# Patient Record
Sex: Male | Born: 1990 | Race: White | Hispanic: No | Marital: Married | Smoking: Never smoker
Health system: Southern US, Community
[De-identification: ages and names within clinical notes are randomized; demographics above are authoritative.]

## PROBLEM LIST (undated history)

## (undated) DIAGNOSIS — H5350 Unspecified color vision deficiencies: Secondary | ICD-10-CM

## (undated) DIAGNOSIS — K602 Anal fissure, unspecified: Secondary | ICD-10-CM

## (undated) DIAGNOSIS — K56609 Unspecified intestinal obstruction, unspecified as to partial versus complete obstruction: Secondary | ICD-10-CM

## (undated) DIAGNOSIS — F4310 Post-traumatic stress disorder, unspecified: Secondary | ICD-10-CM

## (undated) DIAGNOSIS — F419 Anxiety disorder, unspecified: Secondary | ICD-10-CM

## (undated) DIAGNOSIS — F329 Major depressive disorder, single episode, unspecified: Secondary | ICD-10-CM

## (undated) DIAGNOSIS — F32A Depression, unspecified: Secondary | ICD-10-CM

## (undated) HISTORY — DX: Anxiety disorder, unspecified: F41.9

## (undated) HISTORY — PX: SHOULDER ARTHROSCOPY: SHX128

## (undated) HISTORY — PX: DG HAND RIGHT COMPLETE (ARMC HX): HXRAD1530

## (undated) HISTORY — DX: Anal fissure, unspecified: K60.2

## (undated) HISTORY — DX: Post-traumatic stress disorder, unspecified: F43.10

## (undated) HISTORY — DX: Unspecified intestinal obstruction, unspecified as to partial versus complete obstruction: K56.609

## (undated) HISTORY — DX: Depression, unspecified: F32.A

## (undated) HISTORY — DX: Major depressive disorder, single episode, unspecified: F32.9

## (undated) HISTORY — PX: COLONOSCOPY: SHX174

---
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
## 2012-05-03 ENCOUNTER — Ambulatory Visit (HOSPITAL_COMMUNITY)
Admission: RE | Admit: 2012-05-03 | Discharge: 2012-05-03 | Disposition: A | Discharge status: Home / Self Care | Payer: BC Managed Care – PPO | Attending: Psychiatry | Admitting: Psychiatry

## 2012-05-03 DIAGNOSIS — F3289 Other specified depressive episodes: Secondary | ICD-10-CM | POA: Insufficient documentation

## 2012-05-03 DIAGNOSIS — F329 Major depressive disorder, single episode, unspecified: Secondary | ICD-10-CM | POA: Insufficient documentation

## 2012-05-03 NOTE — BH Assessment (Signed)
Assessment Note   Todd Drake is an 22 y.o. male that presents with his mother at the request of Dr. Donell Beers for worsening depression and r/o PTSD.  Pt reports that seven months ago, his roommate (in [place]) killed himself while he was there.  Pt then lost his dog soon thereafter.  Since then, pt admits worsening depressive symptoms, that have increased as of late to decreased level of functioning and inability to handle daily living.  Pt reports difficulty falling or staying asleep, poor appetite, apathy, hopelessness, nightmares and flashbacks, and recently, auditory and visual hallucinations (inaudible sounds and occasionally sees reptiles) without command.  Pt voices occasional SI without plan or intent, last time was several weeks ago.  Pt has come from [place], where he currently resides, to stay with his mother, until he can stabilize.  Pt does admit a lengthy history of SA, starting at age 44 when he began smoking Cannibus; admits use of Alcohol, Cocaine, Heroine, and Pharmaceuticals.  In 2009, pt was hospitalized in [place] for substance abuse treatment.  Pt has not abused any substances in three years +, per pt report.  Pt started seeing Dr. Donell Beers, but is refusing medications to treat symptoms "It is just my preference," so he was referred to the [facility] psych outpatient program.  Pt is to begin program on Monday, May 5th. Emergency resources and recommendations provided and pt is able to contract for safety.    Axis I: r/o PTSD Axis II: Deferred Axis III: No past medical history on file. Axis IV: other psychosocial or environmental problems and problems related to social environment Axis V: 41-50 serious symptoms  Past Medical History: No past medical history on file.  No past surgical history on file.  Family History: No family history on file.  Social History:  has no tobacco, alcohol, and drug history on file.  Additional Social History:  Alcohol / Drug Use Pain Medications:  No Prescriptions: No; refuses Over the Counter: No History of alcohol / drug use?: Yes (Pt reports "You name it, I've done it. Clean 3 years.") Longest period of sobriety (when/how long): 3 years Negative Consequences of Use: Financial;Legal;Personal relationships;Work / School  CIWA:   COWS:    Allergies: Allergies not on file  Home Medications:  (Not in a hospital admission)  OB/GYN Status:  No LMP for male patient.  General Assessment Data Location of Assessment: [facility] Assessment Services ACT Assessment: Yes Living Arrangements: Non-relatives/Friends Can pt return to current living arrangement?: Yes Admission Status: Voluntary Is patient capable of signing voluntary admission?: Yes Transfer from:  (Dr. Donell Beers) Referral Source: Psychiatrist  Education Status Is patient currently in school?: No Highest grade of school patient has completed: college  Risk to self Suicidal Ideation: Yes-Currently Present Suicidal Intent: No Is patient at risk for suicide?: Yes Suicidal Plan?: No Access to Means: Yes Specify Access to Suicidal Means: sharps and meds available What has been your use of drugs/alcohol within the last 12 months?: none in 3 years per report Previous Attempts/Gestures: No How many times?: 0 Other Self Harm Risks: neglecting Triggers for Past Attempts: Unpredictable Intentional Self Injurious Behavior: None Family Suicide History: No Recent stressful life event(s): Conflict (Comment);Trauma (Comment);Turmoil (Comment) (two friends committed suicide within past year; dog died) Persecutory voices/beliefs?: No Depression: Yes Depression Symptoms: Insomnia;Isolating;Fatigue;Loss of interest in usual pleasures;Feeling worthless/self pity Substance abuse history and/or treatment for substance abuse?: Yes Suicide prevention information given to non-admitted patients: Yes  Risk to Others Homicidal Ideation: No Thoughts of  Harm to Others: No Current Homicidal  Intent: No Current Homicidal Plan: No Access to Homicidal Means: No Identified Victim: none per pt History of harm to others?: No Assessment of Violence: None Noted Violent Behavior Description: pt is apathetic and somnolent Does patient have access to weapons?: No Criminal Charges Pending?: No Does patient have a court date: No  Psychosis Hallucinations: Auditory;Visual (inaudible voices and sees reptiles) Delusions: None noted  Mental Status Report Appear/Hygiene: Other (Comment) (casual in street clothes) Eye Contact: Good Motor Activity: Unremarkable Speech: Soft Level of Consciousness: Alert Mood: Depressed;Sad;Empty;Ambivalent Affect: Apathetic;Appropriate to circumstance;Depressed;Sad Anxiety Level: Minimal Thought Processes: Relevant Judgement: Unimpaired Orientation: Person;Place;Time;Situation;Appropriate for developmental age Obsessive Compulsive Thoughts/Behaviors: Moderate  Cognitive Functioning Concentration: Decreased Memory: Recent Intact;Remote Intact IQ: Average Insight: Fair Impulse Control: Fair Appetite: Poor Weight Loss: 0 Weight Gain: 0 Sleep: Decreased Total Hours of Sleep:  (2-4 hours) Vegetative Symptoms: Staying in bed  ADLScreening [facility] Assessment Services) Patient's cognitive ability adequate to safely complete daily activities?: Yes Patient able to express need for assistance with ADLs?: Yes Independently performs ADLs?: Yes (appropriate for developmental age)  Abuse/Neglect [facility]) Physical Abuse: Denies Verbal Abuse: Denies Sexual Abuse: Denies  Prior Inpatient Therapy Prior Inpatient Therapy: Yes Prior Therapy Dates: 2009 Prior Therapy Facilty/Provider(s): In [place] for SA Reason for Treatment: multiple substance use  Prior Outpatient Therapy Prior Outpatient Therapy: Yes Prior Therapy Dates: currently Prior Therapy Facilty/Provider(s): Dr. Donell Beers Reason for Treatment: depression and r/o PTSD  ADL Screening (condition at  time of admission) Patient's cognitive ability adequate to safely complete daily activities?: Yes Patient able to express need for assistance with ADLs?: Yes Independently performs ADLs?: Yes (appropriate for developmental age) Weakness of Legs: None Weakness of Arms/Hands: None  Home Assistive Devices/Equipment Home Assistive Devices/Equipment: None    Abuse/Neglect Assessment (Assessment to be complete while patient is alone) Physical Abuse: Denies Verbal Abuse: Denies Sexual Abuse: Denies Exploitation of patient/patient's resources: Denies Self-Neglect: Denies Values / Beliefs Cultural Requests During Hospitalization: None Spiritual Requests During Hospitalization: None Consults Spiritual Care Consult Needed: No Social Work Consult Needed: No Merchant navy officer (For Healthcare) Advance Directive: Patient does not have advance directive    Additional Information 1:1 In Past 12 Months?: No CIRT Risk: No Elopement Risk: No Does patient have medical clearance?: No     Disposition: Begins intensive outpatient treatment on Monday, May 5th.   Disposition Initial Assessment Completed for this Encounter: Yes Disposition of Patient: Outpatient treatment Type of outpatient treatment: Psych Intensive Outpatient (scheduled to begin Monday, May 5th)  On Site Evaluation by:   Reviewed with Physician:     Angelica Ran 05/03/2012 2:20 PM

## 2012-05-04 ENCOUNTER — Telehealth: Payer: Self-pay | Admitting: Hematology & Oncology

## 2012-05-04 NOTE — Telephone Encounter (Signed)
Left pt message to call and schedule appointment °

## 2012-05-05 ENCOUNTER — Telehealth: Payer: Self-pay | Admitting: Hematology & Oncology

## 2012-05-05 NOTE — Telephone Encounter (Signed)
Left message for pt to call and schedule appointment °

## 2012-05-06 ENCOUNTER — Telehealth: Payer: Self-pay | Admitting: Hematology & Oncology

## 2012-05-06 NOTE — Telephone Encounter (Signed)
Left pt message to call for appointment. Todd Drake at referring aware Pt will not return my calls.

## 2012-05-16 ENCOUNTER — Encounter (HOSPITAL_COMMUNITY): Payer: Self-pay

## 2012-05-16 ENCOUNTER — Other Ambulatory Visit (HOSPITAL_COMMUNITY): Payer: BC Managed Care – PPO | Attending: Psychiatry | Admitting: Psychiatry

## 2012-05-16 DIAGNOSIS — F329 Major depressive disorder, single episode, unspecified: Secondary | ICD-10-CM | POA: Insufficient documentation

## 2012-05-16 DIAGNOSIS — F3289 Other specified depressive episodes: Secondary | ICD-10-CM | POA: Insufficient documentation

## 2012-05-16 DIAGNOSIS — F331 Major depressive disorder, recurrent, moderate: Secondary | ICD-10-CM

## 2012-05-16 DIAGNOSIS — F4310 Post-traumatic stress disorder, unspecified: Secondary | ICD-10-CM
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
## 2012-05-16 NOTE — Progress Notes (Signed)
    Daily Group Progress Note  Program: IOP  Group Time: 9:00-10:30 am   Participation Level: Active  Behavioral Response: Appropriate  Type of Therapy:  Process Group  Summary of Progress: Today was patients first day in the group. He was introduced, attentive and observed the group process.      Group Time: 10:30 am - 12:00 pm   Participation Level:  Active  Behavioral Response: Appropriate  Type of Therapy: Psycho-education Group  Summary of Progress:  Patient participated in a grief and loss group, identified current losses and effective grieving strategies.   Carman Ching, LCSW

## 2012-05-17 ENCOUNTER — Other Ambulatory Visit (HOSPITAL_COMMUNITY): Payer: Self-pay

## 2012-05-17 ENCOUNTER — Telehealth (HOSPITAL_COMMUNITY): Payer: Self-pay | Admitting: Psychiatry

## 2012-05-18 ENCOUNTER — Other Ambulatory Visit (HOSPITAL_COMMUNITY): Payer: BC Managed Care – PPO | Attending: Psychiatry | Admitting: Psychiatry

## 2012-05-18 DIAGNOSIS — F4310 Post-traumatic stress disorder, unspecified: Secondary | ICD-10-CM | POA: Insufficient documentation

## 2012-05-18 DIAGNOSIS — F329 Major depressive disorder, single episode, unspecified: Secondary | ICD-10-CM

## 2012-05-18 DIAGNOSIS — F332 Major depressive disorder, recurrent severe without psychotic features: Secondary | ICD-10-CM | POA: Insufficient documentation

## 2012-05-18 MED ORDER — MIRTAZAPINE 15 MG PO TABS: 15.0000 mg | ORAL_TABLET | Freq: Every day | ORAL | Status: DC

## 2012-05-18 NOTE — Progress Notes (Signed)
    Daily Group Progress Note  Program: IOP  Group Time: 9:00-10:30 am   Participation Level: None  Behavioral Response: Rigid  Type of Therapy:  Process Group  Summary of Progress: Patient returned after missing group yesterday. He was told he was missed. He sat quietly and did not share. He is working on trusting the group to share his story.      Group Time: 10:30 am - 12:00 pm   Participation Level:  None  Behavioral Response: Appropriate  Type of Therapy: Psycho-education Group  Summary of Progress: Patient learned about the DBT Distress Tolerance skills of Self-Soothing and improving the moment to manage stressful feelings and make them a part of everyday care.  Carman Ching, LCSW

## 2012-05-18 NOTE — Progress Notes (Signed)
Patient ID: Todd Drake, male   DOB: 11/27/1990, 22 y.o.   MRN: [id number] Patient reviewed and interviewed today, states went on a hike 2 days ago and yesterday felt sick nauseated and was prescribed so from the urgent care. Patient is more open in discussing his trauma, discussed PTSD and depression and anxiety with him and also discussed treating it and he is comfortable with that so I discussed the rationale risks benefits options of Remeron with him and he gave me his informed consent. He'll start Remeron 7.5 mg tonight and will increase it to 15 mg in 2 days. Denies suicidal or homicidal ideation and has no hallucinations or delusions.
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
## 2012-05-19 ENCOUNTER — Other Ambulatory Visit (HOSPITAL_COMMUNITY): Payer: BC Managed Care – PPO | Attending: Psychiatry | Admitting: Psychiatry

## 2012-05-19 ENCOUNTER — Other Ambulatory Visit: Payer: Self-pay | Admitting: Family Medicine

## 2012-05-19 DIAGNOSIS — F332 Major depressive disorder, recurrent severe without psychotic features: Secondary | ICD-10-CM | POA: Insufficient documentation

## 2012-05-19 DIAGNOSIS — F4310 Post-traumatic stress disorder, unspecified: Secondary | ICD-10-CM

## 2012-05-19 DIAGNOSIS — F329 Major depressive disorder, single episode, unspecified: Secondary | ICD-10-CM

## 2012-05-19 DIAGNOSIS — R1032 Left lower quadrant pain: Secondary | ICD-10-CM

## 2012-05-19 HISTORY — DX: Post-traumatic stress disorder, unspecified: F43.10
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
## 2012-05-20 ENCOUNTER — Other Ambulatory Visit (HOSPITAL_COMMUNITY): Payer: BC Managed Care – PPO | Attending: Psychiatry | Admitting: Psychiatry

## 2012-05-20 DIAGNOSIS — F332 Major depressive disorder, recurrent severe without psychotic features: Secondary | ICD-10-CM | POA: Insufficient documentation

## 2012-05-20 DIAGNOSIS — F329 Major depressive disorder, single episode, unspecified: Secondary | ICD-10-CM

## 2012-05-20 DIAGNOSIS — F4310 Post-traumatic stress disorder, unspecified: Secondary | ICD-10-CM | POA: Insufficient documentation

## 2012-05-20 NOTE — Progress Notes (Signed)
    Daily Group Progress Note  Program: IOP  Group Time: 9:00-10:30 am   Participation Level: Active  Behavioral Response: Appropriate  Type of Therapy:  Process Group  Summary of Progress: Patient participated in a goodbye ceremony to two patients ending the group today and practiced having healthy closure and grieving loss associated with them leaving.       Group Time: 10:30 am - 12:00 pm   Participation Level:  Active  Behavioral Response: Appropriate  Type of Therapy: Psycho-education Group  Summary of Progress:  Patient explored how they would maintain wellness over the weekend and what coping skills would be used to manage wellness until the group resumes again on Monday.  Audel Coakley E, LCSW 

## 2012-05-23 ENCOUNTER — Other Ambulatory Visit (HOSPITAL_COMMUNITY): Payer: BC Managed Care – PPO | Attending: Psychiatry | Admitting: Psychiatry

## 2012-05-23 DIAGNOSIS — F332 Major depressive disorder, recurrent severe without psychotic features: Secondary | ICD-10-CM | POA: Insufficient documentation

## 2012-05-23 DIAGNOSIS — F4310 Post-traumatic stress disorder, unspecified: Secondary | ICD-10-CM | POA: Insufficient documentation

## 2012-05-23 DIAGNOSIS — F329 Major depressive disorder, single episode, unspecified: Secondary | ICD-10-CM

## 2012-05-23 NOTE — Progress Notes (Signed)
    Daily Group Progress Note  Program: IOP  Group Time: 9:00-10:30 am   Participation Level: Active  Behavioral Response: Appropriate  Type of Therapy:  Process Group  Summary of Progress: Patient reports high depression today and presents with flat affect. He only talked when called upon to share. He was attentive but did not engage voluntarily. He continues to grieve the death of his dog and two friends and is learning how to identify, express and cope with these difficult feelings.      Group Time: 10:30 am - 12:00 pm   Participation Level:  Active  Behavioral Response: Appropriate  Type of Therapy: Psycho-education Group  Summary of Progress: patient participated in a group on grief and loss and identified current causes of loss and healthy grieving strategies.   Carman Ching, LCSW

## 2012-05-23 NOTE — Progress Notes (Signed)
Patient ID: Todd Drake, male   DOB: 1990-06-30, 22 y.o.   MRN: [id number] Patient reviewed and interviewed today, states this good friend died of an overdose 2 days ago. Patient is grieving that loss very tearful dysphoric and sad. Has no thoughts of suicide and is able to contract for safety. Encourage patient to start making a picture as attribute to each of his dad friends he stated understanding. No hallucinations or delusions continue Remeron 15 mg each bedtime.

## 2012-05-24 ENCOUNTER — Other Ambulatory Visit (HOSPITAL_COMMUNITY): Payer: BC Managed Care – PPO | Attending: Psychiatry | Admitting: Psychiatry

## 2012-05-24 ENCOUNTER — Ambulatory Visit
Admission: RE | Admit: 2012-05-24 | Discharge: 2012-05-24 | Disposition: A | Discharge status: Home / Self Care | Payer: BC Managed Care – PPO | Source: Ambulatory Visit | Attending: Family Medicine | Admitting: Family Medicine

## 2012-05-24 DIAGNOSIS — R1032 Left lower quadrant pain: Secondary | ICD-10-CM

## 2012-05-24 DIAGNOSIS — F329 Major depressive disorder, single episode, unspecified: Secondary | ICD-10-CM

## 2012-05-24 DIAGNOSIS — F4310 Post-traumatic stress disorder, unspecified: Secondary | ICD-10-CM | POA: Insufficient documentation

## 2012-05-24 DIAGNOSIS — F332 Major depressive disorder, recurrent severe without psychotic features: Secondary | ICD-10-CM | POA: Insufficient documentation

## 2012-05-24 NOTE — Progress Notes (Signed)
Patient ID: Todd Drake, male   DOB: 1990-10-24, 22 y.o.   MRN: [id number] Pt seen continuing to grieve the loss of his friend, No si/ Hi. Sleep, fair, appetite -fair to poor. Cont meds . Discussed group therapy.

## 2012-05-24 NOTE — Progress Notes (Signed)
    Daily Group Progress Note  Program: IOP  Group Time: 9:00-10:30 am   Participation Level: Active  Behavioral Response: Appropriate  Type of Therapy:  Process Group  Summary of Progress: Patient presents with flat affect and severe depressed mood. He only talked when called upon to share and then gave only limited answers in response to questions. He said another friend of his died this past 2022/06/09, but he does not want to talk about it right now. He said he still blames himself for the death of his dog and "hates himself" for it. He is aware that is why he is not getting better but does not know what to do about it. He appears stuck in the grieving process with anger and blame towards himself. He is being encouraged and supported to learn healthy grieving skills.      Group Time: 10:30 am - 12:00 pm   Participation Level:  Active  Behavioral Response: Appropriate  Type of Therapy: Psycho-education Group  Summary of Progress: Patient learned how to use the tool of heartmath to regulate stress and anxiety and practiced using the technique with the computer program to track the stress level in their body.   Carman Ching, LCSW

## 2012-05-25 ENCOUNTER — Telehealth (HOSPITAL_COMMUNITY): Payer: Self-pay | Admitting: Psychiatry

## 2012-05-25 ENCOUNTER — Other Ambulatory Visit (HOSPITAL_COMMUNITY): Payer: Self-pay

## 2012-05-26 ENCOUNTER — Other Ambulatory Visit (HOSPITAL_COMMUNITY): Payer: BC Managed Care – PPO | Attending: Psychiatry | Admitting: Psychiatry

## 2012-05-26 DIAGNOSIS — F4310 Post-traumatic stress disorder, unspecified: Secondary | ICD-10-CM

## 2012-05-26 DIAGNOSIS — F332 Major depressive disorder, recurrent severe without psychotic features: Secondary | ICD-10-CM | POA: Insufficient documentation

## 2012-05-26 DIAGNOSIS — F32A Depression, unspecified: Secondary | ICD-10-CM

## 2012-05-26 DIAGNOSIS — F329 Major depressive disorder, single episode, unspecified: Secondary | ICD-10-CM

## 2012-05-26 NOTE — Progress Notes (Signed)
    Daily Group Progress Note  Program: IOP  Group Time: 9:00-10:30 am   Participation Level: Active  Behavioral Response: Appropriate  Type of Therapy:  Process Group  Summary of Progress: Patient returned to group after missing yesterday and states he struggled with "sharp pain in his stomach" that started six months ago. He states he had a test to explore the cause, but does not have the results back yet. Patient talked some about the grief he is experiencing over his friends death this past 14-Jun-2022 and states it was from an overdose of heroine. He presented with flat affect and minimal verbal response, only in answering a direct question. He continues to present with hopeless mood and affect.      Group Time: 10:30 am - 12:00 pm   Participation Level:  Active  Behavioral Response: Appropriate  Type of Therapy: Psycho-education Group  Summary of Progress: Patient participated in a goodbye ceremony for a member leaving and practiced having healthy closure and grieving loss.   Carman Ching, LCSW

## 2012-05-27 ENCOUNTER — Other Ambulatory Visit (HOSPITAL_COMMUNITY): Payer: BC Managed Care – PPO | Attending: Psychiatry | Admitting: Psychiatry

## 2012-05-27 DIAGNOSIS — F329 Major depressive disorder, single episode, unspecified: Secondary | ICD-10-CM

## 2012-05-27 DIAGNOSIS — F4310 Post-traumatic stress disorder, unspecified: Secondary | ICD-10-CM | POA: Insufficient documentation

## 2012-05-27 DIAGNOSIS — F332 Major depressive disorder, recurrent severe without psychotic features: Secondary | ICD-10-CM | POA: Insufficient documentation

## 2012-05-27 NOTE — Progress Notes (Signed)
    Daily Group Progress Note  Program: IOP  Group Time: 9:00-10:30 am   Participation Level: Active  Behavioral Response: Appropriate  Type of Therapy:  Process Group  Summary of Progress: Patient was more engaged today than any of day he has been in the group. He was talkative and tearful when talking about his life. He described not being where he wants to be with his professional and personal life. He talked about the sadness he is feeling for the losses he has had recently. Members gave him feedback on opening up for the first time and he appeared happy to receive this feedback based on his facial expression.      Group Time: 10:30 am - 12:00 pm   Participation Level:  Active  Behavioral Response: Appropriate  Type of Therapy: Psycho-education Group  Summary of Progress: Patient participated in a goodbye ceremony for a member ending group today and practiced the skill of having healthy closure and grieving losses.   Carman Ching, LCSW

## 2012-05-31 ENCOUNTER — Other Ambulatory Visit (HOSPITAL_COMMUNITY): Payer: BC Managed Care – PPO | Attending: Psychiatry | Admitting: Psychiatry

## 2012-05-31 DIAGNOSIS — F332 Major depressive disorder, recurrent severe without psychotic features: Secondary | ICD-10-CM | POA: Insufficient documentation

## 2012-05-31 DIAGNOSIS — F4310 Post-traumatic stress disorder, unspecified: Secondary | ICD-10-CM | POA: Insufficient documentation

## 2012-05-31 DIAGNOSIS — F329 Major depressive disorder, single episode, unspecified: Secondary | ICD-10-CM

## 2012-05-31 NOTE — Progress Notes (Signed)
Patient ID: HILLEL CARD, male   DOB: 1990/03/17, 22 y.o.   MRN: [id number] Patient viewed an interview today, complains of body rash and feels its because of the medication. Patient states that he did not change his laundry detergent not did he eat any new food and so feel strongly its because of the medicine. He also is complaining of difficulty urinating. Discontinue the Remeron today encourage patient to take Benadryl 25 mg twice a day we'll check up on him in 2 days. No suicidal or homicidal ideation no hallucinations or delusions.

## 2012-05-31 NOTE — Progress Notes (Signed)
    Daily Group Progress Note  Program: IOP  Group Time: 9:00-10:30 am   Participation Level: Minimal  Behavioral Response: Appropriate  Type of Therapy:  Process Group  Summary of Progress: Patient presented with high depression and anxiety based on his affect and appearance, but reported moderate symptoms. Pt looked severely depressed with minimal affect and appeared disheveled, causing concern among the group members. Pt denied thoughts to harm self but talked about severe guilt still associated with the death of his dog and feeling responsible. Pt does not appear to be making progress and only talks when called upon to share. Pt said again that he does not feel he should forgive himself and feels he should punish himself for what he did to his dog. Pt is being encouraged to share and learn healthy grieving skills.      Group Time: 10:30 am - 12:00 pm   Participation Level:  Active  Behavioral Response: Appropriate  Type of Therapy: Psycho-education Group  Summary of Progress: Patient participated in a goodbye ceremony to a member leaving the group and practiced the skill of having healthy closure.   Carman Ching, LCSW

## 2012-06-01 ENCOUNTER — Telehealth (HOSPITAL_COMMUNITY): Payer: Self-pay | Admitting: Psychiatry

## 2012-06-01 ENCOUNTER — Other Ambulatory Visit (HOSPITAL_COMMUNITY): Payer: BC Managed Care – PPO

## 2012-06-02 ENCOUNTER — Telehealth (HOSPITAL_COMMUNITY): Payer: Self-pay | Admitting: Psychiatry

## 2012-06-02 ENCOUNTER — Other Ambulatory Visit (HOSPITAL_COMMUNITY): Payer: Self-pay
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
## 2012-06-02 NOTE — Patient Instructions (Signed)
Pt requesting discharge today.  Will follow up with Dr. Donell Beers and Dr. Ellery Plunk.  Encouraged support groups.

## 2012-06-02 NOTE — Progress Notes (Signed)
Patient ID: [name], male   DOB: May 14, 1990, 22 y.o.   MRN: [id number] Discharge Note  Patient:  Todd Drake is an 22 y.o., male DOB:  26-Aug-1990  Date of Admission:  05/16/12  Date of Discharge:  06/02/12  Reason for Admission: Depression anxiety , also unresolved grief regarding the death of his dog that he had left in the car and 2 friends.  Hospital C ourse: Patient started IOP and was found to be very anxious and depressed and anxious and had severe insomnia with nightmares and flashbacks of his dead dog so his Lexapro was discontinued and he was put on Remeron 15 mg at bedtime. He tolerated the medication well but would not participate in groups and was very closed. Patient complained that they were too many females in the group and he could not talk much. Patient then developed a rash after a week of being on the Remeron and attribute did it to the Remeron and wanted to discontinue it and decided to then discontinue it. Patient was asked to take Benadryl but did not take Benadryl, he called the IOP manager Ms. Jeri Modena and informed her that he was not interested in returning to IOP. Patient was asked to speak to Dr. Donell Beers regarding not wanting to followup since Dr. Olevia Bowens was the one that had referred him. Patient stated understanding.  Mental  Status at Discharge: alert, oriented x3, affect was blunted mood is depressed speech was monotonous. Denied suicidal or homicidal ideation and had no hallucinations or delusions. Recent and remote memory was fair judgment and insight were poor, concentration and recall was fair   Lab Results: No results found for this or any previous visit (from the past 48 hour(s)).  No current outpatient prescriptions on file.  Axis Diagnosis:   Axis I: Major Depression, Recurrent severe and Post Traumatic Stress Disorder Axis II: Cluster C Traits Axis III:  Past Medical History  Diagnosis Date  . Depression   . Anxiety   . PTSD  (post-traumatic stress disorder)    Axis IV: economic problems, housing problems, other psychosocial or environmental problems, problems related to social environment and problems with primary support group Axis V: 61-70 mild symptoms   Level of Care:  OP  Discharge destination:  Home  Is patient on multiple antipsychotic therapies at discharge:  No    Has Patient had three or more failed trials of antipsychotic monotherapy by history:  No  Patient phone:  [phone] (home)  Patient address:   [address] [place] [address],   Follow-up recommendations:  Activity:  As tolerated Diet:  Regular Other:  Followup with Dr. Milinda Antis heading for therapy  Comments:    The patient received suicide prevention pamphlet:  Yes  Margit Banda 06/02/2012, 11:51 AM

## 2012-06-02 NOTE — Telephone Encounter (Signed)
D:  Returned pt's call.  According to pt he is still ill (ie. Fever,  body rash, joints swollen, and trouble urinating).  Encouraged pt to go the ED or his doctor.  Pt states he will just let it run it's course. Inquired if pt would be returning to [facility].  Pt states that he wouldn't be returning due to the group being all male.  "I think I would do better with an individual counselor." A:  Will d/c pt today after speaking with Dr. Rutherford Limerick.  F/U with Dr. Maisie Fus Hedding (Dr. Caprice Renshaw recommendation).  Also, f/u with Dr. Donell Beers.  Encouraged support groups. R:  Pt receptive.

## 2012-06-03 ENCOUNTER — Other Ambulatory Visit (HOSPITAL_COMMUNITY): Payer: Self-pay

## 2012-06-05 ENCOUNTER — Emergency Department (HOSPITAL_COMMUNITY): Payer: BC Managed Care – PPO

## 2012-06-05 ENCOUNTER — Emergency Department (HOSPITAL_COMMUNITY)
Admission: EM | Admit: 2012-06-05 | Discharge: 2012-06-05 | Disposition: A | Discharge status: Home / Self Care | Payer: BC Managed Care – PPO | Attending: Emergency Medicine | Admitting: Emergency Medicine

## 2012-06-05 ENCOUNTER — Encounter (HOSPITAL_COMMUNITY): Payer: Self-pay | Admitting: Family Medicine

## 2012-06-05 DIAGNOSIS — F411 Generalized anxiety disorder: Secondary | ICD-10-CM | POA: Insufficient documentation

## 2012-06-05 DIAGNOSIS — F4310 Post-traumatic stress disorder, unspecified: Secondary | ICD-10-CM | POA: Insufficient documentation

## 2012-06-05 DIAGNOSIS — K5900 Constipation, unspecified: Secondary | ICD-10-CM

## 2012-06-05 DIAGNOSIS — F329 Major depressive disorder, single episode, unspecified: Secondary | ICD-10-CM | POA: Insufficient documentation

## 2012-06-05 DIAGNOSIS — F3289 Other specified depressive episodes: Secondary | ICD-10-CM | POA: Insufficient documentation

## 2012-06-05 DIAGNOSIS — R3911 Hesitancy of micturition: Secondary | ICD-10-CM | POA: Insufficient documentation

## 2012-06-05 DIAGNOSIS — Z87891 Personal history of nicotine dependence: Secondary | ICD-10-CM | POA: Insufficient documentation

## 2012-06-05 LAB — COMPREHENSIVE METABOLIC PANEL
AST: 21 U/L (ref 0–37)
Albumin: 4.4 g/dL (ref 3.5–5.2)
Alkaline Phosphatase: 50 U/L (ref 39–117)
Chloride: 96 mEq/L (ref 96–112)
GFR calc Af Amer: >90 mL/min (ref >90)
Total Bilirubin: 0.9 mg/dL (ref 0.3–1.2)
Total Protein: 7.4 g/dL (ref 6.0–8.3)

## 2012-06-05 LAB — CBC WITH DIFFERENTIAL/PLATELET
Basophils Absolute: 0 10*3/uL (ref 0.0–0.1)
Eosinophils Absolute: 0.1 10*3/uL (ref 0.0–0.7)
MCV: 83.9 fL (ref 78.0–100.0)
Monocytes Absolute: 0.5 10*3/uL (ref 0.1–1.0)
Monocytes Relative: 12 % (ref 3–12)
Neutro Abs: 2 10*3/uL (ref 1.7–7.7)
Platelets: 204 10*3/uL (ref 150–400)
RDW: 11.8 % (ref 11.5–15.5)

## 2012-06-05 LAB — URINALYSIS, ROUTINE W REFLEX MICROSCOPIC
Glucose, UA: NEGATIVE mg/dL
Leukocytes, UA: NEGATIVE
Nitrite: NEGATIVE
Protein, ur: NEGATIVE mg/dL

## 2012-06-05 LAB — LIPASE, BLOOD: Lipase: 25 U/L (ref 11–59)

## 2012-06-05 MED ORDER — HYDROCODONE-ACETAMINOPHEN 5-325 MG PO TABS: 1.0000 | ORAL_TABLET | ORAL | Status: DC | PRN

## 2012-06-05 MED ORDER — ONDANSETRON HCL 4 MG/2ML IJ SOLN
4.0000 mg | Freq: Once | INTRAMUSCULAR | Status: AC
  Administered 2012-06-05: 4 mg via INTRAVENOUS
  Filled 2012-06-05: qty 2

## 2012-06-05 MED ORDER — FENTANYL CITRATE 0.05 MG/ML IJ SOLN
50.0000 ug | Freq: Once | INTRAMUSCULAR | Status: AC
  Administered 2012-06-05: 50 ug via INTRAVENOUS
  Filled 2012-06-05: qty 2

## 2012-06-05 MED ORDER — POLYETHYLENE GLYCOL 3350 17 GM/SCOOP PO POWD: 17.0000 g | Freq: Every day | ORAL | Status: DC

## 2012-06-05 MED ORDER — HYDROMORPHONE HCL PF 1 MG/ML IJ SOLN
1.0000 mg | Freq: Once | INTRAMUSCULAR | Status: AC
  Administered 2012-06-05: 1 mg via INTRAVENOUS
  Filled 2012-06-05: qty 1

## 2012-06-05 MED ORDER — FLEET ENEMA 7-19 GM/118ML RE ENEM: 1.0000 | ENEMA | Freq: Once | RECTAL | Status: DC

## 2012-06-05 NOTE — ED Notes (Signed)
Patient states that he has had abdominal pain for the past 5 months. States tonight pain is worse, in lower abdomen radiating into groin. Reports also bilateral flank pain and difficulty urinating. States he has had dysuria for the past month.

## 2012-06-05 NOTE — ED Provider Notes (Signed)
History     CSN: [id number]  Arrival date & time 06/05/12  0128   First MD Initiated Contact with Patient 06/05/12 0253      Chief Complaint  Patient presents with  . Abdominal Pain    (Consider location/radiation/quality/duration/timing/severity/associated sxs/prior treatment) HPI  Pt to the ER with complaints of abdominal pains that have been for 5 months. Over the past two days the pain significantly worsened and he was unable to sleep. He has had urinary hesitancy. Pain is left lower quadrant.  He has had problems with constipation and has not been able to have a normal bowel movement in a few days. He endorses having 1 episode which he felt was bile. No OTC medications or Fentanyl has helped his pain. He denies diarrhea, fevers, weakness, chest pains, SOB, rash, hx of abdominal pains/kidney stones.  Past Medical History  Diagnosis Date  . Depression   . Anxiety   . PTSD (post-traumatic stress disorder)     History reviewed. No pertinent past surgical history.  Family History  Problem Relation Age of Onset  . Depression Mother     History  Substance Use Topics  . Smoking status: Former Smoker    Types: Cigarettes  . Smokeless tobacco: Not on file  . Alcohol Use: No      Review of Systems  Gastrointestinal: Positive for abdominal pain and constipation.  All other systems reviewed and are negative.    Allergies  Lexapro and Remeron  Home Medications   Current Outpatient Rx  Name  Route  Sig  Dispense  Refill  . acetaminophen (TYLENOL) 500 MG tablet   Oral   Take 500 mg by mouth every 6 (six) hours as needed for pain.         . diphenhydrAMINE (BENADRYL) 25 MG tablet   Oral   Take 25 mg by mouth every 6 (six) hours as needed for itching or allergies.         Marland Kitchen ibuprofen (ADVIL,MOTRIN) 200 MG tablet   Oral   Take 400 mg by mouth every 6 (six) hours as needed for pain.         Marland Kitchen MELATONIN PO   Oral   Take 1 tablet by mouth at bedtime as needed  (for sleep).         Marland Kitchen HYDROcodone-acetaminophen (NORCO/VICODIN) 5-325 MG per tablet   Oral   Take 1 tablet by mouth every 4 (four) hours as needed for pain.   10 tablet   0   . polyethylene glycol powder (GLYCOLAX/MIRALAX) powder   Oral   Take 17 g by mouth daily.   255 g   0     Take starting on 06/06/2012 two times a day for the  ...   . sodium phosphate (FLEET) 7-19 GM/118ML ENEM   Rectal   Place 1 enema rectally once.   1 enema   1     BP 127/88  Pulse 86  Temp(Src) 98.3 F (36.8 C) (Oral)  Resp 22  Ht 5\' 11"  (1.803 m)  Wt 160 lb (72.576 kg)  BMI 22.33 kg/m2  SpO2 98%  Physical Exam  Nursing note and vitals reviewed. Constitutional: He appears well-developed and well-nourished. No distress.  HENT:  Head: Normocephalic and atraumatic.  Eyes: Pupils are equal, round, and reactive to light.  Neck: Normal range of motion. Neck supple.  Cardiovascular: Normal rate and regular rhythm.   Pulmonary/Chest: Effort normal.  Abdominal: Soft. He exhibits no shifting dullness, no distension,  no pulsatile liver and no ascites. There is tenderness in the left lower quadrant. There is no rigidity, no rebound and no guarding.    Neurological: He is alert.  Skin: Skin is warm and dry.    ED Course  Procedures (including critical care time)  Labs Reviewed  CBC WITH DIFFERENTIAL - Abnormal; Notable for the following:    MCHC 36.8 (*)    All other components within normal limits  URINALYSIS, ROUTINE W REFLEX MICROSCOPIC - Abnormal; Notable for the following:    Ketones, ur >80 (*)    All other components within normal limits  COMPREHENSIVE METABOLIC PANEL  LIPASE, BLOOD   Dg Abd Acute W/chest  06/05/2012   *RADIOLOGY REPORT*  Clinical Data: Abdominal pain, constipation.  ACUTE ABDOMEN SERIES (ABDOMEN 2 VIEW & CHEST 1 VIEW)  Comparison: 09/24/2004 chest radiograph  Findings: Lungs are clear.  Cardiomediastinal contours are within normal range.  No free intraperitoneal  air. The bowel gas pattern is non- obstructive. Organ outlines are normal where seen. No acute or aggressive osseous abnormality identified.  IMPRESSION: Nonobstructive bowel gas pattern.   Original Report Authenticated By: Jearld Lesch, M.D.     1. Constipation       MDM  Patients labs and xray are unremarkable. Will treat for constipation. Pain medication helped his pain.  Referral to GI given.  Pt has been advised of the symptoms that warrant their return to the ED. Patient has voiced understanding and has agreed to follow-up with the PCP or specialist.         Dorthula Matas, PA-C 06/05/12 7829

## 2012-06-05 NOTE — ED Provider Notes (Signed)
Medical screening examination/treatment/procedure(s) were performed by non-physician practitioner and as supervising physician I was immediately available for consultation/collaboration.  Mikaya Bunner M Kenitra Leventhal, MD 06/05/12 0613 

## 2012-06-06 ENCOUNTER — Other Ambulatory Visit (HOSPITAL_COMMUNITY): Payer: Self-pay

## 2012-06-07 ENCOUNTER — Other Ambulatory Visit (HOSPITAL_COMMUNITY): Payer: Self-pay

## 2012-06-08 ENCOUNTER — Other Ambulatory Visit (HOSPITAL_COMMUNITY): Payer: Self-pay

## 2012-06-09 ENCOUNTER — Other Ambulatory Visit (HOSPITAL_COMMUNITY): Payer: Self-pay

## 2012-06-10 ENCOUNTER — Other Ambulatory Visit (HOSPITAL_COMMUNITY): Payer: Self-pay

## 2012-06-13 ENCOUNTER — Other Ambulatory Visit (HOSPITAL_COMMUNITY): Payer: Self-pay

## 2012-06-14 ENCOUNTER — Other Ambulatory Visit (HOSPITAL_COMMUNITY): Payer: Self-pay

## 2012-06-15 ENCOUNTER — Other Ambulatory Visit (HOSPITAL_COMMUNITY): Payer: Self-pay

## 2012-06-16 ENCOUNTER — Other Ambulatory Visit (HOSPITAL_COMMUNITY): Payer: Self-pay

## 2012-06-17 ENCOUNTER — Other Ambulatory Visit (HOSPITAL_COMMUNITY): Payer: Self-pay

## 2012-06-20 ENCOUNTER — Other Ambulatory Visit (HOSPITAL_COMMUNITY): Payer: Self-pay

## 2015-05-27 ENCOUNTER — Emergency Department (HOSPITAL_COMMUNITY)
Admission: EM | Admit: 2015-05-27 | Discharge: 2015-05-28 | Disposition: A | Discharge status: Home / Self Care | Payer: BLUE CROSS/BLUE SHIELD | Attending: Emergency Medicine | Admitting: Emergency Medicine

## 2015-05-27 ENCOUNTER — Emergency Department (HOSPITAL_COMMUNITY): Payer: BLUE CROSS/BLUE SHIELD

## 2015-05-27 ENCOUNTER — Encounter (HOSPITAL_COMMUNITY): Payer: Self-pay | Admitting: Emergency Medicine

## 2015-05-27 DIAGNOSIS — Z79891 Long term (current) use of opiate analgesic: Secondary | ICD-10-CM | POA: Insufficient documentation

## 2015-05-27 DIAGNOSIS — R52 Pain, unspecified: Secondary | ICD-10-CM | POA: Diagnosis not present

## 2015-05-27 DIAGNOSIS — R0789 Other chest pain: Secondary | ICD-10-CM | POA: Diagnosis present

## 2015-05-27 DIAGNOSIS — E860 Dehydration: Secondary | ICD-10-CM | POA: Insufficient documentation

## 2015-05-27 DIAGNOSIS — F329 Major depressive disorder, single episode, unspecified: Secondary | ICD-10-CM | POA: Insufficient documentation

## 2015-05-27 DIAGNOSIS — Z87891 Personal history of nicotine dependence: Secondary | ICD-10-CM | POA: Diagnosis not present

## 2015-05-27 HISTORY — DX: Unspecified color vision deficiencies: H53.50

## 2015-05-27 LAB — BASIC METABOLIC PANEL
Anion gap: 7 (ref 5–15)
BUN: 16 mg/dL (ref 6–20)
CHLORIDE: 105 mmol/L (ref 101–111)
CO2: 27 mmol/L (ref 22–32)
Calcium: 9.5 mg/dL (ref 8.9–10.3)
Creatinine, Ser: 0.87 mg/dL (ref 0.61–1.24)
GFR calc Af Amer: >60 mL/min (ref >60)
GFR calc non Af Amer: >60 mL/min (ref >60)
GLUCOSE: 97 mg/dL (ref 65–99)
POTASSIUM: 3.7 mmol/L (ref 3.5–5.1)
Sodium: 139 mmol/L (ref 135–145)

## 2015-05-27 LAB — CBC
HEMATOCRIT: 43.4 % (ref 39.0–52.0)
HEMOGLOBIN: 15.6 g/dL (ref 13.0–17.0)
MCH: 32.6 pg (ref 26.0–34.0)
MCHC: 35.9 g/dL (ref 30.0–36.0)
MCV: 90.6 fL (ref 78.0–100.0)
Platelets: 206 10*3/uL (ref 150–400)
RBC: 4.79 MIL/uL (ref 4.22–5.81)
RDW: 12.3 % (ref 11.5–15.5)
WBC: 8.6 10*3/uL (ref 4.0–10.5)

## 2015-05-27 LAB — I-STAT TROPONIN, ED: Troponin i, poc: 0 ng/mL (ref 0.00–0.08)

## 2015-05-27 NOTE — ED Notes (Signed)
Pt is c/o chest pain  Unable to say how long it has been going on and unable to say exactly where it hurts  When asked where he points to several different areas  Pt states his circulation is bad and his blood pressure has been messed up but unable to say if it has been high or low  Pt has odd behavior noted in triage  Denies drug use

## 2015-05-28 ENCOUNTER — Emergency Department (HOSPITAL_COMMUNITY): Payer: BLUE CROSS/BLUE SHIELD

## 2015-05-28 LAB — HEPATIC FUNCTION PANEL
ALT: 21 U/L (ref 17–63)
AST: 24 U/L (ref 15–41)
Albumin: 4.7 g/dL (ref 3.5–5.0)
Alkaline Phosphatase: 51 U/L (ref 38–126)
Total Bilirubin: 0.8 mg/dL (ref 0.3–1.2)
Total Protein: 6.8 g/dL (ref 6.5–8.1)

## 2015-05-28 LAB — LIPASE, BLOOD: Lipase: 30 U/L (ref 11–51)

## 2015-05-28 MED ORDER — DIPHENHYDRAMINE HCL 25 MG PO CAPS
50.0000 mg | ORAL_CAPSULE | Freq: Once | ORAL | Status: AC
  Administered 2015-05-28: 50 mg via ORAL
  Filled 2015-05-28: qty 2

## 2015-05-28 MED ORDER — METOCLOPRAMIDE HCL 10 MG PO TABS
10.0000 mg | ORAL_TABLET | Freq: Once | ORAL | Status: AC
  Administered 2015-05-28: 10 mg via ORAL
  Filled 2015-05-28: qty 1

## 2015-05-28 MED ORDER — KETOROLAC TROMETHAMINE 60 MG/2ML IM SOLN
60.0000 mg | Freq: Once | INTRAMUSCULAR | Status: AC
  Administered 2015-05-28: 60 mg via INTRAMUSCULAR
  Filled 2015-05-28: qty 2

## 2015-05-28 NOTE — Discharge Instructions (Signed)
Pain Without a Known Cause Mr. Manson PasseyBrown, your work up tonight was completely normal.  See a primary care doctor to help you with your chronic symptoms.  If any symptoms worsen, come back to the ED immediately. Thank you. WHAT IS PAIN WITHOUT A KNOWN CAUSE? Pain can occur in any part of the body and can range from mild to severe. Sometimes no cause can be found for why you are having pain. Some types of pain that can occur without a known cause include:   Headache.  Back pain.  Abdominal pain.  Neck pain. HOW IS PAIN WITHOUT A KNOWN CAUSE DIAGNOSED?  Your health care provider will try to find the cause of your pain. This may include:  Physical exam.  Medical history.  Blood tests.  Urine tests.  X-rays. If no cause is found, your health care provider may diagnose you with pain without a known cause.  IS THERE TREATMENT FOR PAIN WITHOUT A CAUSE?  Treatment depends on the kind of pain you have. Your health care provider may prescribe medicines to help relieve your pain.  WHAT CAN I DO AT HOME FOR MY PAIN?   Take medicines only as directed by your health care provider.  Stop any activities that cause pain. During periods of severe pain, bed rest may help.  Try to reduce your stress with activities such as yoga or meditation. Talk to your health care provider for other stress-reducing activity recommendations.  Exercise regularly, if approved by your health care provider.  Eat a healthy diet that includes fruits and vegetables. This may improve pain. Talk to your health care provider if you have any questions about your diet. WHAT IF MY PAIN DOES NOT GET BETTER?  If you have a painful condition and no reason can be found for the pain or the pain gets worse, it is important to follow up with your health care provider. It may be necessary to repeat tests and look further for a possible cause.    This information is not intended to replace advice given to you by your health care  provider. Make sure you discuss any questions you have with your health care provider.   Document Released: 09/16/2000 Document Revised: 01/12/2014 Document Reviewed: 05/09/2013 Elsevier Interactive Patient Education [employer]2016 [employer] Inc[employer]

## 2015-05-28 NOTE — ED Provider Notes (Signed)
CSN: [id number]     Arrival date & time 05/27/15  1949 History  By signing my name below, I, [facility] LPM[facility]issa Drake, attest that this documentation has been prepared under the direction and in the presence of Tomasita CrumbleAdeleke Tina Temme, MD. Electronically Signed: Randell PatientMarrissa Drake, ED Scribe. 05/28/2015. 2:40 AM.   Chief Complaint  Patient presents with  . Chest Pain   The history is provided by the patient. No language interpreter was used.   HPI Comments: Todd Drake is a 25 y.o. male with an hx of depression, anxiety, and PTSD who presents to the Emergency Department complaining of intermitent, sharp, burning CP ongoing for the past few months, worse today. Pt states that he had one episode of blindness that lasted for one hour before resolving without treatment 2 weeks ago. He reports a generalized abdominal pain, moderate HA, difficulty breathing during physical activity, photophobia, and urinary urgency with difficulty voiding. Signigicant other states that the pt has been belching recently. Per pt, he has had an increase in his panic attack. Per pt, he was hospitalized once in the past for a bowel obstruction. Denies bowel or bladder incontinence, constipation, diarrhea, or any other symptoms currently  Past Medical History  Diagnosis Date  . Depression   . Anxiety   . PTSD (post-traumatic stress disorder)   . Color blind    Past Surgical History  Procedure Laterality Date  . Colonoscopy     Family History  Problem Relation Age of Onset  . Depression Mother    Social History  Substance Use Topics  . Smoking status: Former Smoker    Types: Cigarettes  . Smokeless tobacco: None  . Alcohol Use: Yes    Review of Systems A complete 10 system review of systems was obtained and all systems are negative except as noted in the HPI and PMH.   Allergies  Lexapro and Remeron  Home Medications   Prior to Admission medications   Medication Sig Start Date End Date Taking? Authorizing Provider   HYDROcodone-acetaminophen (NORCO/VICODIN) 5-325 MG per tablet Take 1 tablet by mouth every 4 (four) hours as needed for pain. 06/05/12   Tiffany Neva SeatGreene, PA-C  polyethylene glycol powder (GLYCOLAX/MIRALAX) powder Take 17 g by mouth daily. 06/05/12   Tiffany Neva SeatGreene, PA-C  sodium phosphate (FLEET) 7-19 GM/118ML ENEM Place 1 enema rectally once. 06/05/12   Tiffany Neva SeatGreene, PA-C   BP 128/85 mmHg  Pulse 66  Temp(Src) 98.3 F (36.8 C) (Oral)  Resp 9  SpO2 98% Physical Exam  Constitutional: He is oriented to person, place, and time. Vital signs are normal. He appears well-developed and well-nourished.  Non-toxic appearance. He does not appear ill. No distress.  HENT:  Head: Normocephalic and atraumatic.  Nose: Nose normal.  Mouth/Throat: Oropharynx is clear and moist. No oropharyngeal exudate.  Eyes: Conjunctivae and EOM are normal. Pupils are equal, round, and reactive to light. No scleral icterus.  Neck: Normal range of motion. Neck supple. No tracheal deviation, no edema, no erythema and normal range of motion present. No thyroid mass and no thyromegaly present.  Cardiovascular: Normal rate, regular rhythm, S1 normal, S2 normal, normal heart sounds, intact distal pulses and normal pulses.  Exam reveals no gallop and no friction rub.   No murmur heard. Pulmonary/Chest: Effort normal and breath sounds normal. No respiratory distress. He has no wheezes. He has no rhonchi. He has no rales.  Abdominal: Soft. Normal appearance and bowel sounds are normal. He exhibits no distension, no ascites and no mass. There  is no hepatosplenomegaly. There is no tenderness. There is no rebound, no guarding and no CVA tenderness.  Musculoskeletal: Normal range of motion. He exhibits no edema or tenderness.  Lymphadenopathy:    He has no cervical adenopathy.  Neurological: He is alert and oriented to person, place, and time. He has normal strength. No cranial nerve deficit or sensory deficit.  Normal strength and  sensation in all extremities. Normal cerebellar test.  Skin: Skin is warm, dry and intact. No petechiae and no rash noted. He is not diaphoretic. No erythema. No pallor.  Psychiatric: He has a normal mood and affect. His behavior is normal. Judgment normal.  Nursing note and vitals reviewed.   ED Course  Procedures   DIAGNOSTIC STUDIES: Oxygen Saturation is 100% on RA, normal by my interpretation.    COORDINATION OF CARE: 12:50 AM Discussed results of labs. Will order more labs, abdomen x-ray, and Toradol, Reglan, and Benadryl. Discussed treatment plan with pt at bedside and pt agreed to plan.   Labs Review Labs Reviewed  HEPATIC FUNCTION PANEL - Abnormal; Notable for the following:    Bilirubin, Direct <0.1 (*)    All other components within normal limits  BASIC METABOLIC PANEL  CBC  LIPASE, BLOOD  I-STAT TROPOININ, ED    Imaging Review Dg Chest 2 View  05/27/2015  CLINICAL DATA:  Chest pain, shortness of breath. EXAM: CHEST  2 VIEW COMPARISON:  June 05, 2012. FINDINGS: The heart size and mediastinal contours are within normal limits. Both lungs are clear. No pneumothorax or pleural effusion is noted. The visualized skeletal structures are unremarkable. IMPRESSION: No active cardiopulmonary disease. Electronically Signed   By: Lupita Raider, M.D.   On: 05/27/2015 20:30   I have personally reviewed and evaluated these images and lab results as part of my medical decision-making.   EKG Interpretation   Date/Time:  Monday May 27 2015 20:03:30 EDT Ventricular Rate:  94 PR Interval:  134 QRS Duration: 95 QT Interval:  328 QTC Calculation: 410 R Axis:   102 Text Interpretation:  Sinus rhythm Right atrial enlargement Consider right  ventricular hypertrophy No old tracing to compare Confirmed by Erroll Luna [id number]) on 05/27/2015 11:55:38 PM      MDM   Final diagnoses:  None   Patient presents to the ED for pain throughout his body.  He states he can not  urinate or have a bowel movement normally and he is dehydrated. Also states he has woken up blind in the morning.  His work up is entirely normal in the ED. He states this has been going on for months. I recommended PCP fu within  3days.  No acute process currently. He appears well with a normal neuro exam.  He is in no acute distress.  VS remain within his normal limits and he is safe for DC.   I personally performed the services described in this documentation, which was scribed in my presence. The recorded information has been reviewed and is accurate.      Tomasita Crumble, MD 05/28/15 [phone]

## 2015-05-29 ENCOUNTER — Other Ambulatory Visit: Payer: Self-pay | Admitting: Family Medicine

## 2015-05-29 DIAGNOSIS — R109 Unspecified abdominal pain: Secondary | ICD-10-CM

## 2015-06-04 ENCOUNTER — Ambulatory Visit
Admission: RE | Admit: 2015-06-04 | Discharge: 2015-06-04 | Disposition: A | Discharge status: Home / Self Care | Payer: BLUE CROSS/BLUE SHIELD | Source: Ambulatory Visit | Attending: Family Medicine | Admitting: Family Medicine

## 2015-06-04 DIAGNOSIS — R109 Unspecified abdominal pain: Secondary | ICD-10-CM

## 2018-03-22 IMAGING — US US ABDOMEN COMPLETE
1 series · 14 of 25 positions shown · non-contrast
Comparison: Abdomen films of 05/28/2015

CLINICAL DATA: Intermittent abdominal pain

EXAM:
ABDOMEN ULTRASOUND COMPLETE

[Series 1: us abdomen complete · 0.20mm/px · 14 of 91 slices shown]
[im 1/91]
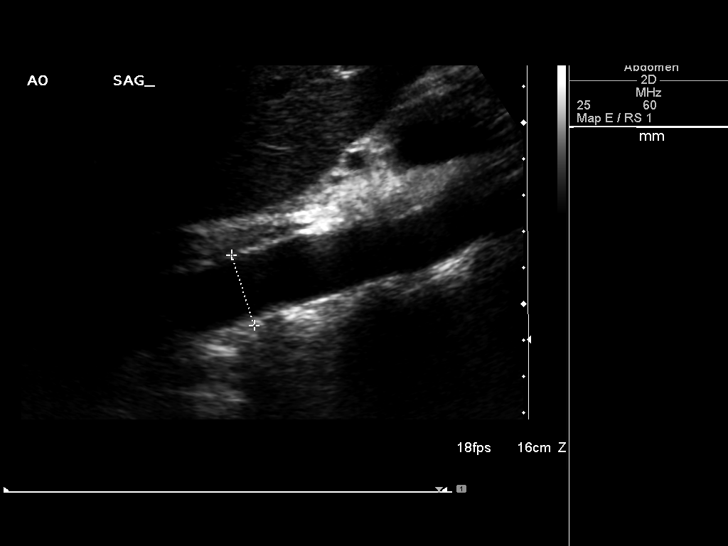
[im 8/91]
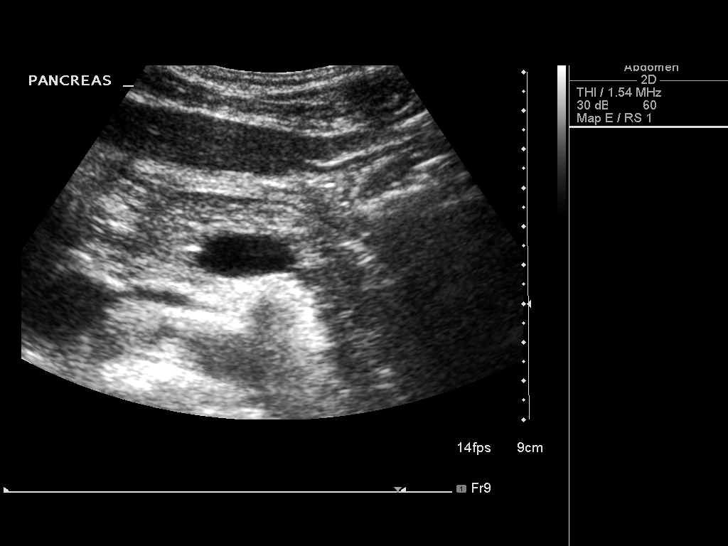
[im 16/91]
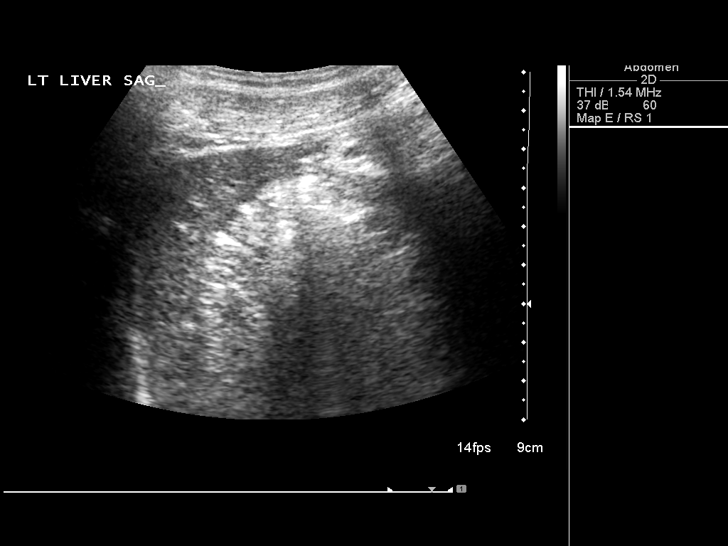
[im 23/91]
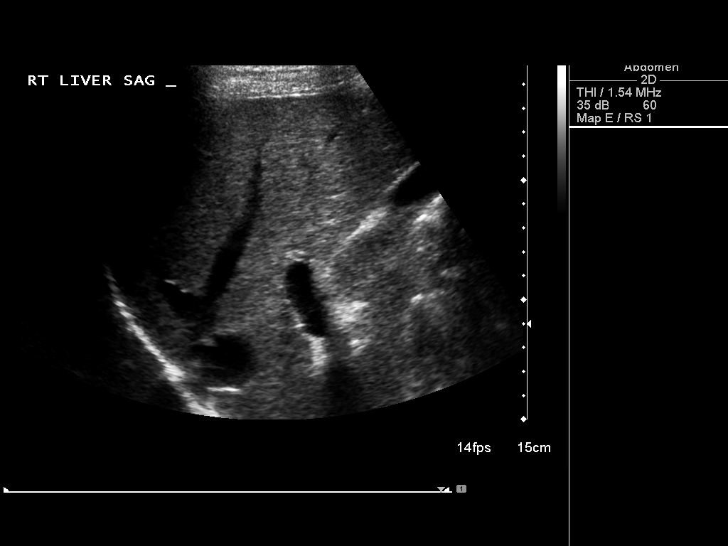
[im 31/91]
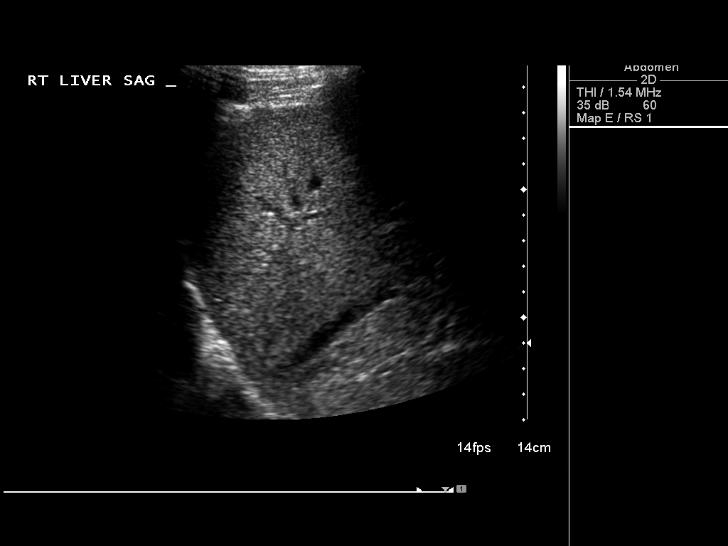
[im 34/91]
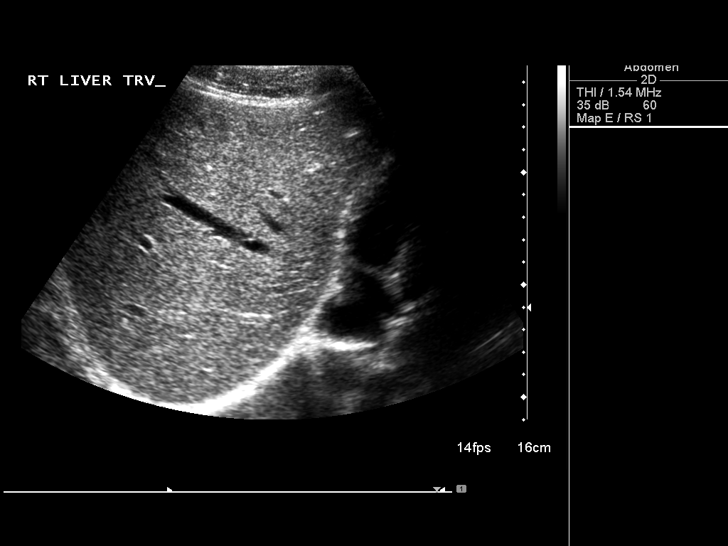
[im 42/91]
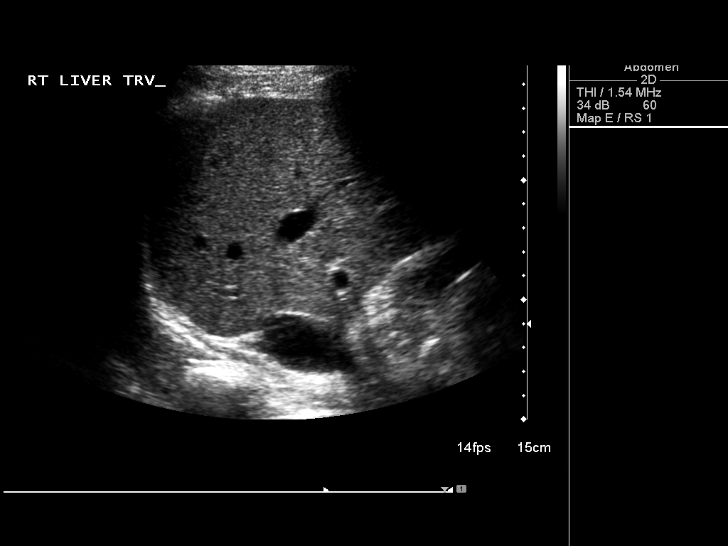
[im 49/91]
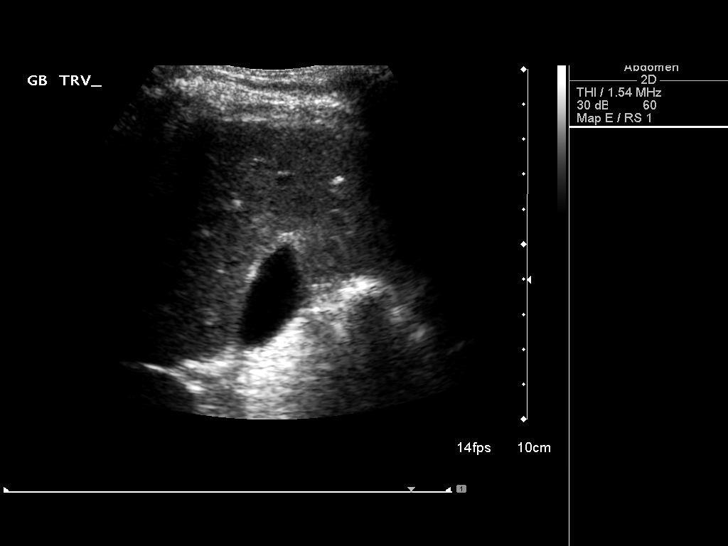
[im 57/91]
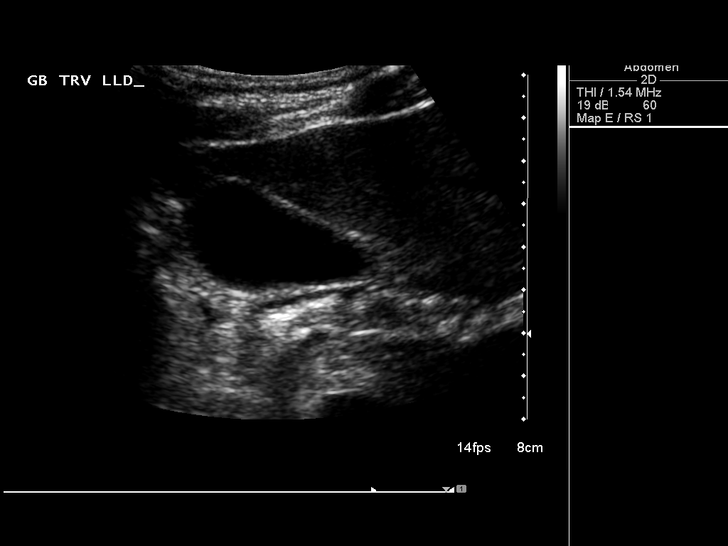
[im 61/91]
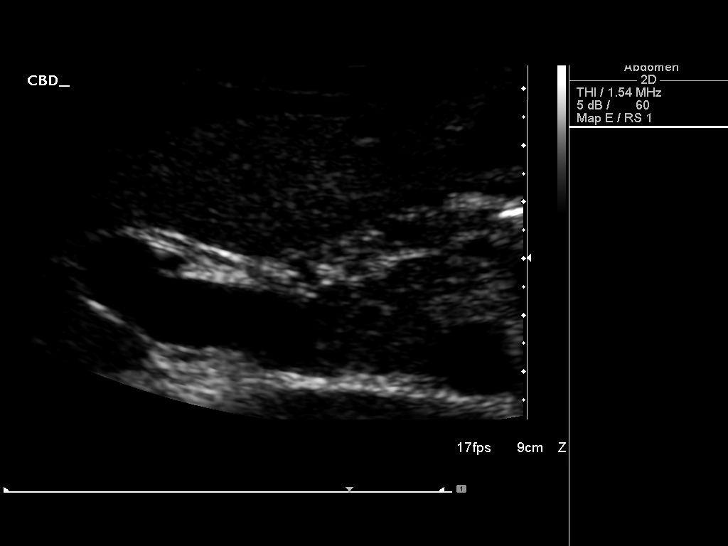
[im 68/91]
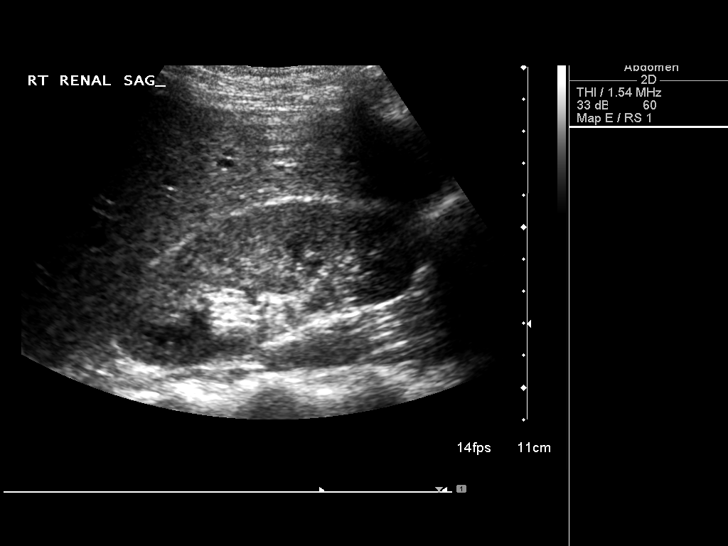
[im 76/91]
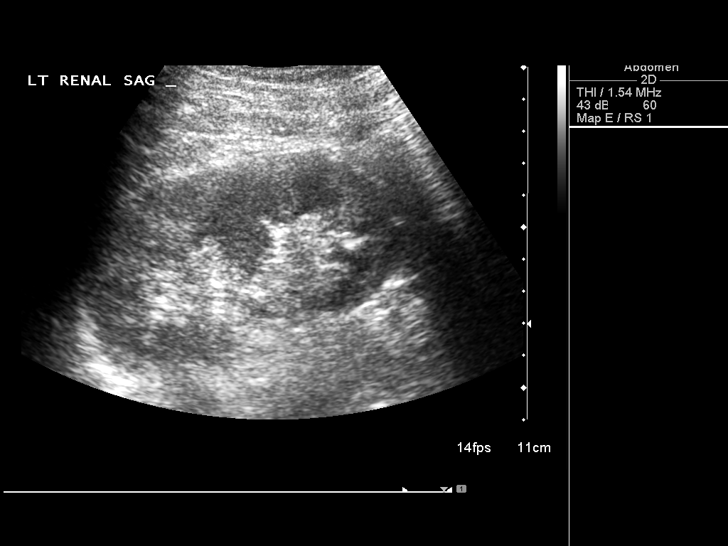
[im 83/91]
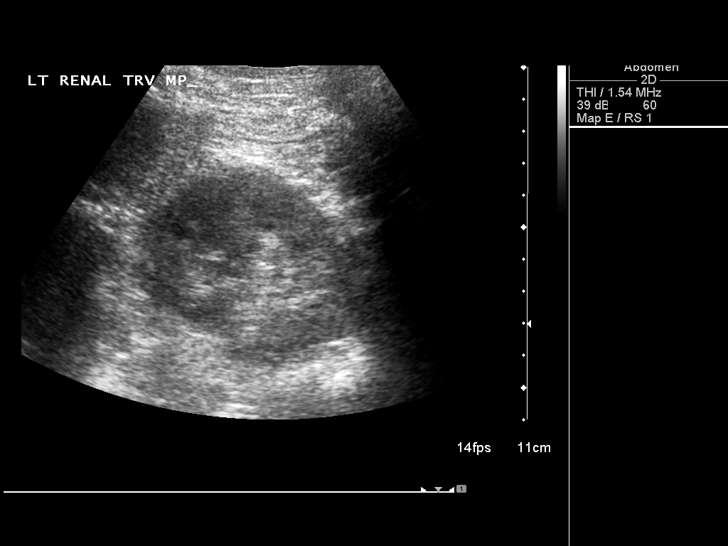
[im 91/91]
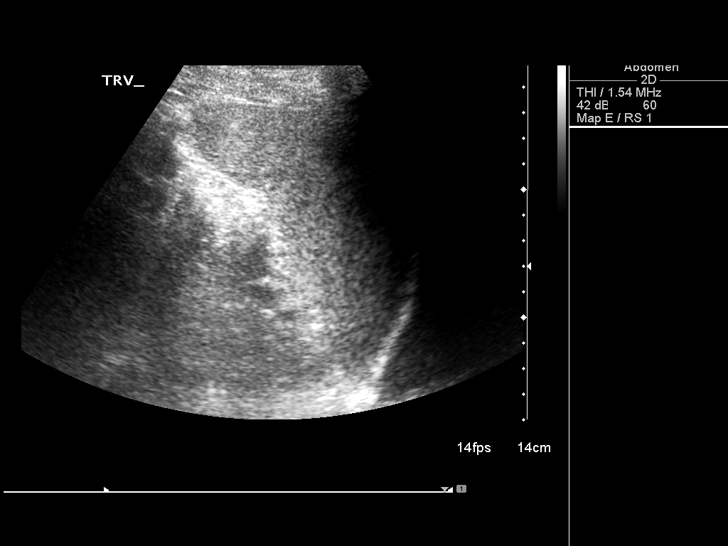

[14 of 25 positions shown; findings below may reference images not displayed]

FINDINGS: Gallbladder: The gallbladder is visualized and no gallstones are
noted. There is no pain over the gallbladder with compression.

Common bile duct: Diameter: The common bile duct is normal measuring
2.6 mm in diameter.

Liver: The liver has a normal echogenic pattern. No focal hepatic
abnormality is seen.

IVC: No abnormality visualized.

Pancreas: The pancreas is moderately well seen with only the most
distal tail obscured

Spleen: 5.8 cm.  No abnormality is seen

Right Kidney: Length: 10.8 cm..  No hydronephrosis is noted.

Left Kidney: Length: 10.3 cm..  No hydronephrosis is seen.

Abdominal aorta: The abdominal aorta is normal in caliber.

Other findings: None.
IMPRESSION: 1. Negative abdominal ultrasound. No gallstones. No ductal
dilatation.

## 2023-01-14 ENCOUNTER — Ambulatory Visit (INDEPENDENT_AMBULATORY_CARE_PROVIDER_SITE_OTHER): Payer: No Typology Code available for payment source | Admitting: Family

## 2023-01-14 ENCOUNTER — Encounter: Payer: Self-pay | Admitting: Physician Assistant

## 2023-01-14 ENCOUNTER — Encounter: Payer: Self-pay | Admitting: Family

## 2023-01-14 VITALS — BP 143/88 | HR 70 | Temp 98.0°F | Ht 71.0 in | Wt 192.0 lb

## 2023-01-14 DIAGNOSIS — K648 Other hemorrhoids: Secondary | ICD-10-CM | POA: Diagnosis not present

## 2023-01-14 NOTE — Patient Instructions (Signed)
 Welcome to [facility], It was a pleasure meeting you today!   I have sent a referral to our gastroenterology office. Let me know if you have not heard from them by end of next week.  You can schedule a physical with fasting labs today.    PLEASE NOTE: If you had any LAB tests please let us  know if you have not heard back within a few days. You may see your results on MyChart before we have a chance to review them but we will give you a call once they are reviewed by us . If we ordered any REFERRALS today, please let us  know if you have not heard from their office within the next week.  Let us  know through MyChart if you are needing REFILLS, or have your pharmacy send us  the request. You can also use MyChart to communicate with me or any office staff.  Please try these tips to maintain a healthy lifestyle: It is important that you exercise regularly at least 30 minutes 5 times a week. Think about what you will eat, plan ahead. Choose whole foods, & think  clean, green, fresh or frozen over canned, processed or packaged foods which are more sugary, salty, and fatty. 70 to 75% of food eaten should be fresh vegetables and protein. 2-3  meals daily with healthy snacks between meals, but must be whole fruit, protein or vegetables. Aim to eat over a 10 hour period when you are active, for example, 7am to 5pm, and then STOP after your last meal of the day, drinking only water.  Shorter eating windows, 6-8 hours, are showing benefits in heart disease and blood sugar regulation. Drink water every day! Shoot for 64 ounces daily = 8 cups, no other drink is as healthy! Fruit juice is best enjoyed in a healthy way, by EATING the fruit.

## 2023-01-14 NOTE — Progress Notes (Signed)
 New Patient Office Visit  Subjective:  Patient ID: Todd Drake, male    DOB: [birth year]  Age: 33 y.o. MRN: [id number]  CC:  Chief Complaint  Patient presents with   New Patient (Initial Visit)   Hemorrhoids    Pt c/o hemorrhoids, has tried suppositories and wipes which does not help. Present for a year.     HPI Todd Drake presents for establishing care today.  Discussed the use of AI scribe software for clinical note transcription with the patient, who gave verbal consent to proceed.  History of Present Illness   The patient, with a history of hemorrhoids and polyps, presents with a current hemorrhoid. He reports that he the hemorrhoid is protruding externally. Despite using over-the-counter suppositories and hydrocortisone cream, the hemorrhoid persists and has begun to impact his daily life. The patient describes the hemorrhoids as persistently present, though the level of inflammation and pain varies. He also reports occasional bleeding, primarily noticed when wiping, but not to the extent of filling the toilet bowl. The patient has been managing his bowel movements to avoid straining and maintains good hydration. He also supplements his diet with magnesium and fiber.  The patient also has a history of polyps, discovered during a colonoscopy approximately four to five years ago. The exact number of polyps is unknown, but the patient believes there were multiple. This colonoscopy was prompted by his mother's history of colon cancer. The patient has not had a follow-up colonoscopy since moving from [place].     Assessment & Plan:     Hemorrhoids - Recurrent, large, and protruding hemorrhoid causing discomfort and occasional bleeding. Over-the-counter treatments have been ineffective. -Refer to Gastroenterology for potential ligation/banding -Continue over-the-counter treatments including suppositories, increasing to bid x 5d and hydrocortisone cream 3-4x/day.  Colon Polyps  - History of colon polyps and family history (mother) of colon cancer. Last colonoscopy approximately 3-4 years ago. -Refer to Gastroenterology for potential colonoscopy and to establish care.  General Health Maintenance - -Schedule physical examination w/fasting labs today. -Obtain previous medical records from [facility] in [place], [place] .     Subjective:    Outpatient Medications Prior to Visit  Medication Sig Dispense Refill   HYDROcodone -acetaminophen  (NORCO/VICODIN) 5-325 MG per tablet Take 1 tablet by mouth every 4 (four) hours as needed for pain. (Patient not taking: Reported on 01/14/2023) 10 tablet 0   polyethylene glycol powder (GLYCOLAX /MIRALAX ) powder Take 17 g by mouth daily. (Patient not taking: Reported on 01/14/2023) 255 g 0   sodium phosphate (FLEET) 7-19 GM/118ML ENEM Place 1 enema rectally once. (Patient not taking: Reported on 01/14/2023) 1 enema 1   No facility-administered medications prior to visit.   Past Medical History:  Diagnosis Date   Anxiety    Color blind    Depression    PTSD (post-traumatic stress disorder)    Past Surgical History:  Procedure Laterality Date   COLONOSCOPY     SHOULDER ARTHROSCOPY Left    tore ligament, 2024    Objective:   Today's Vitals: BP (!) 143/88 (BP Location: Left Arm, Patient Position: Sitting, Cuff Size: Large)   Pulse 70   Temp 98 F (36.7 C) (Temporal)   Ht 5' 11 (1.803 m)   Wt 192 lb (87.1 kg)   SpO2 98%   BMI 26.78 kg/m   Physical Exam Vitals and nursing note reviewed.  Constitutional:      General: He is not in acute distress.    Appearance: Normal appearance.  HENT:     Head: Normocephalic.  Cardiovascular:     Rate and Rhythm: Normal rate and regular rhythm.  Pulmonary:     Effort: Pulmonary effort is normal.     Breath sounds: Normal breath sounds.  Musculoskeletal:        General: Normal range of motion.     Cervical back: Normal range of motion.  Skin:    General:  Skin is warm and dry.  Neurological:     Mental Status: He is alert and oriented to person, place, and time.  Psychiatric:        Mood and Affect: Mood normal.     No orders of the defined types were placed in this encounter.   Lucius Krabbe, NP

## 2023-01-20 ENCOUNTER — Encounter: Payer: Self-pay | Admitting: Family

## 2023-01-20 ENCOUNTER — Ambulatory Visit (INDEPENDENT_AMBULATORY_CARE_PROVIDER_SITE_OTHER): Payer: No Typology Code available for payment source | Admitting: Family

## 2023-01-20 VITALS — BP 113/77 | HR 79 | Temp 98.2°F | Wt 192.6 lb

## 2023-01-20 DIAGNOSIS — Z0000 Encounter for general adult medical examination without abnormal findings: Secondary | ICD-10-CM | POA: Diagnosis not present

## 2023-01-20 DIAGNOSIS — Z1159 Encounter for screening for other viral diseases: Secondary | ICD-10-CM | POA: Diagnosis not present

## 2023-01-20 DIAGNOSIS — Z114 Encounter for screening for human immunodeficiency virus [HIV]: Secondary | ICD-10-CM | POA: Diagnosis not present

## 2023-01-20 NOTE — Progress Notes (Signed)
Chief Complaint: Hemorrhoid prolapse, family history of colon CA Primary GI Doctor: Dr. Barron Alvine  HPI: Patient is a 33 year old male patient with past medical history of anxiety and depression, who was referred to me by Dulce Sellar, NP on 01/14/23 for a complaint of hemorrhoid prolapse and family history of colon CA.    On 01/14/23 Patient presents to PCP with main complaint of hemorrhoids with occasional bleeding not relieved with suppositories and wipes. Pt takes magnesium and fiber. Hx of polyps found approximately 4-5 years ago. Referral made for potential colonoscopy and hemorrhoid banding.  Interval History  Patient presents with main complaint of prolapsed hemorrhoids with occasional bleeding that started about 1 year ago. Patient reports he has one bowel movement daily. History of straining, but has stopped doing this. He will at times feel urge to go to restroom with no result. He reports he has had issues with internal hemorrhoids that prolapse and he has to push them back in. He has tried OTC topical ointments, suppositories, and wipes which have not helped. He has had intermittent BRB with wiping. Patients last colonoscopy was approximately 4-5 years ago in [place], [place] due to family history.  Per patient had multiple benign colonic polyps with hemorrhoids.     Patient denies GERD or dysphagia. Patient denies nausea, vomiting, or weight loss. Socially drinks. Nonsmoker. Patients never had EGD.     Patient's family history includes colon CA in his mother approximately at age 62 years old. Paternal grandfather with liver CA.  Wt Readings from Last 3 Encounters:  01/21/23 192 lb (87.1 kg)  01/20/23 192 lb 9.6 oz (87.4 kg)  01/14/23 192 lb (87.1 kg)   Past Medical History:  Diagnosis Date   Anal fissure    Anxiety    Bowel obstruction (HCC)    Color blind    Depression    PTSD (post-traumatic stress disorder)    PTSD (post-traumatic stress disorder) 05/19/2012   Past  Surgical History:  Procedure Laterality Date   COLONOSCOPY     DG HAND RIGHT COMPLETE ([facility] HX)     SHOULDER ARTHROSCOPY Left    tore ligament, 2024   No current outpatient medications on file.   No current facility-administered medications for this visit.   Allergies as of 01/21/2023 - Review Complete 01/21/2023  Allergen Reaction Noted   Lexapro [escitalopram oxalate] Rash and Other (See Comments) 06/05/2012   Remeron [mirtazapine] Rash and Other (See Comments) 06/05/2012    Family History  Problem Relation Age of Onset   Depression Mother    Cancer - Colon Mother    Liver cancer Maternal Grandfather    Esophageal cancer Neg Hx    Review of Systems:    Constitutional: No weight loss, fever, chills, weakness or fatigue HEENT: Eyes: No change in vision               Ears, Nose, Throat:  No change in hearing or congestion Skin: No rash or itching Cardiovascular: No chest pain, chest pressure or palpitations   Respiratory: No SOB or cough Gastrointestinal: See HPI and otherwise negative Genitourinary: No dysuria or change in urinary frequency Neurological: No headache, dizziness or syncope Musculoskeletal: No new muscle or joint pain Hematologic: No bleeding or bruising Psychiatric: No history of depression or anxiety   Physical Exam:  Vital signs: BP 118/80   Pulse 72   Ht 5\' 11"  (1.803 m)   Wt 192 lb (87.1 kg)   BMI 26.78 kg/m   Constitutional:  Pleasant Caucasian male appears to be in NAD, Well developed, Well nourished, alert and cooperative Throat: Oral cavity and pharynx without inflammation, swelling or lesion.  Respiratory: Respirations even and unlabored. Lungs clear to auscultation bilaterally.   No wheezes, crackles, or rhonchi.  Cardiovascular: Normal S1, S2. Regular rate and rhythm. No peripheral edema, cyanosis or pallor.  Gastrointestinal:  Soft, nondistended, nontender. No rebound or guarding. Normal bowel sounds. No appreciable masses or  hepatomegaly. Rectal: External rectal exam with skin tags, normal rectal tone, non-tender, no masses,Gaugh stool, hemoccult N/A  Anoscopy: appreciated grade 3 internal hemorrhoids Skin:  Dry and intact without significant lesions or rashes. Psychiatric: Oriented to person, place and time. Demonstrates good judgement and reason without abnormal affect or behaviors.  RELEVANT LABS AND IMAGING: CBC    Latest Ref Rng & Units 01/20/2023    2:01 PM 05/27/2015    8:40 PM 06/05/2012    2:30 AM  CBC  WBC 4.0 - 10.5 K/uL 7.2  8.6  4.2   Hemoglobin 13.0 - 17.0 g/dL 78.2  95.6  21.3   Hematocrit 39.0 - 52.0 % 44.8  43.4  39.7   Platelets 150.0 - 400.0 K/uL 233.0  206  204     CMP     Latest Ref Rng & Units 01/20/2023    2:01 PM 05/28/2015    1:36 AM 05/27/2015    8:40 PM  CMP  Glucose 70 - 99 mg/dL 086   97   BUN 6 - 23 mg/dL 17   16   Creatinine 5.78 - 1.50 mg/dL 4.69   6.29   Sodium 528 - 145 mEq/L 141   139   Potassium 3.5 - 5.1 mEq/L 4.1   3.7   Chloride 96 - 112 mEq/L 105   105   CO2 19 - 32 mEq/L 27   27   Calcium 8.4 - 10.5 mg/dL 9.8   9.5   Total Protein 6.0 - 8.3 g/dL 6.9  6.8    Total Bilirubin 0.2 - 1.2 mg/dL 0.6  0.8    Alkaline Phos 39 - 117 U/L 54  51    AST 0 - 37 U/L 18  24    ALT 0 - 53 U/L 18  21      Assessment: Encounter Diagnoses  Name Primary?   Prolapsed internal hemorrhoids, grade 3 Yes   Rectal bleeding    Rectal discomfort    Family history of colon cancer in mother    History of colonic polyps     33 year old male patient that presents with main complaint of internal hemorrhoids, prolapsed with intermittent rectal bleeding. He would like to proceed with hemorrhoid banding soonest available. We will obtain his colonoscopy records to schedule future colon screening for personal history of colonic polyps and family history of colon CA in his mother.  Plan: -Schedule Hemorrhoid banding with Dr. Barron Alvine -We discussed how pelvic floor therapy Johanna Matto be  beneficial in the future. -Schedule colonoscopy at next appointment pending records. -Obtain previous medical records from [facility] [place].     Thank you for the courtesy of this consult. Please call me with any questions or concerns.   Mando Blatz, FNP-C [facility] 01/21/2023, 4:15 PM  Cc: Dulce Sellar, NP

## 2023-01-20 NOTE — Progress Notes (Signed)
 Phone: [phone]  Subjective:  Patient 33 y.o. male presenting for annual physical.  Chief Complaint  Patient presents with   Annual Exam    Non fasting w/ labs    See problem oriented charting- ROS- full  review of systems was completed and negative.  The following were reviewed and entered/updated in epic: Past Medical History:  Diagnosis Date   Anxiety    Color blind    Depression    PTSD (post-traumatic stress disorder)    PTSD (post-traumatic stress disorder) 05/19/2012   Patient Active Problem List   Diagnosis Date Noted   Depression 05/16/2012   Past Surgical History:  Procedure Laterality Date   COLONOSCOPY     SHOULDER ARTHROSCOPY Left    tore ligament, 2024    Family History  Problem Relation Age of Onset   Depression Mother    Cancer - Colon Mother    Liver cancer Maternal Grandfather     Medications- reviewed and updated No current outpatient medications on file.   No current facility-administered medications for this visit.    Allergies-reviewed and updated Allergies  Allergen Reactions   Lexapro [Escitalopram Oxalate] Rash and Other (See Comments)    Chest tightness, sweating, fever, body aches   Remeron  [Mirtazapine ] Rash and Other (See Comments)    Chest tightness, sweating, body aches, fever    Social History   Social History Narrative   Not on file    Objective:  BP 113/77 (BP Location: Left Arm, Patient Position: Sitting, Cuff Size: Large)   Pulse 79   Temp 98.2 F (36.8 C) (Temporal)   Wt 192 lb 9.6 oz (87.4 kg)   SpO2 98%   BMI 26.86 kg/m  Physical Exam Vitals and nursing note reviewed.  Constitutional:      General: He is not in acute distress.    Appearance: Normal appearance.  HENT:     Head: Normocephalic.     Right Ear: Tympanic membrane and external ear normal.     Left Ear: Tympanic membrane and external ear normal.     Nose: Nose normal.     Mouth/Throat:     Mouth: Mucous membranes are moist.  Eyes:      Extraocular Movements: Extraocular movements intact.  Cardiovascular:     Rate and Rhythm: Normal rate and regular rhythm.  Pulmonary:     Effort: Pulmonary effort is normal.     Breath sounds: Normal breath sounds.  Abdominal:     General: Abdomen is flat. There is no distension.     Palpations: Abdomen is soft.     Tenderness: There is no abdominal tenderness.  Musculoskeletal:        General: Normal range of motion.     Cervical back: Normal range of motion.  Skin:    General: Skin is warm and dry.  Neurological:     Mental Status: He is alert and oriented to person, place, and time.  Psychiatric:        Mood and Affect: Mood normal.        Behavior: Behavior normal.        Judgment: Judgment normal.      Assessment and Plan   Health Maintenance counseling: 1. Anticipatory guidance: Patient counseled regarding regular dental exams q6 months, eye exams yearly, avoiding smoking and second hand smoke, limiting alcohol to 2 beverages per day.   2. Risk factor reduction:  Advised patient of need for regular exercise and diet rich in fruits and vegetables to reduce  risk of heart attack and stroke. *Pt reports exercising 3-4d per week. Wt Readings from Last 3 Encounters:  01/20/23 192 lb 9.6 oz (87.4 kg)  01/14/23 192 lb (87.1 kg)  06/05/12 160 lb (72.6 kg)   3. Immunizations/screenings/ancillary studies  There is no immunization history on file for this patient. Health Maintenance Due  Topic Date Due   HIV Screening  Never done   DTaP/Tdap/Td (1 - Tdap) Never done    4. Skin cancer screening-  advised regular sunscreen use. Denies worrisome, changing, or new skin lesions. Pt advised to schedule a skin cancer screening appointment with a Dermatologist, can send referral if needed. 5. Smoking associated screening: non- smoker  6. STD screening - N/A 7. Alcohol screening: socially  Annual physical exam -     CBC with Differential/Platelet -     Comprehensive metabolic  panel -     Lipid panel -     TSH  Screening for HIV (human immunodeficiency virus) -     HIV Antibody (routine testing w rflx)  Need for hepatitis C screening test -     Hepatitis C antibody   Recommended follow up: No follow-ups on file. Future Appointments  Date Time Provider Department Center  01/21/2023  9:00 AM Lemmon, Kathy Parker, PA [facility]    Lab/Order associations:  non-fasting   Versa Gore, NP

## 2023-01-20 NOTE — Patient Instructions (Addendum)
 It was very nice to see you today!   I will review your lab results via MyChart in a few days.   Have a great week!    PLEASE NOTE:  If you had any lab tests please let us know if you have not heard back within a few days. You may see your results on MyChart before we have a chance to review them but we will give you a call once they are reviewed by [place]. If we ordered any referrals today, please let us know if you have not heard from their office within the next week.

## 2023-01-21 ENCOUNTER — Encounter: Payer: Self-pay | Admitting: Gastroenterology

## 2023-01-21 ENCOUNTER — Ambulatory Visit: Payer: No Typology Code available for payment source | Admitting: Gastroenterology

## 2023-01-21 VITALS — BP 118/80 | HR 72 | Ht 71.0 in | Wt 192.0 lb

## 2023-01-21 DIAGNOSIS — K625 Hemorrhage of anus and rectum: Secondary | ICD-10-CM | POA: Diagnosis not present

## 2023-01-21 DIAGNOSIS — Z800 Family history of malignant neoplasm of digestive organs: Secondary | ICD-10-CM | POA: Diagnosis not present

## 2023-01-21 DIAGNOSIS — K642 Third degree hemorrhoids: Secondary | ICD-10-CM

## 2023-01-21 DIAGNOSIS — K6289 Other specified diseases of anus and rectum: Secondary | ICD-10-CM | POA: Diagnosis not present

## 2023-01-21 DIAGNOSIS — Z860100 Personal history of colon polyps, unspecified: Secondary | ICD-10-CM

## 2023-01-21 LAB — CBC WITH DIFFERENTIAL/PLATELET
Basophils Absolute: 0 10*3/uL (ref 0.0–0.1)
Basophils Relative: 0.6 % (ref 0.0–3.0)
Eosinophils Absolute: 0.1 10*3/uL (ref 0.0–0.7)
Eosinophils Relative: 1.2 % (ref 0.0–5.0)
HCT: 44.8 % (ref 39.0–52.0)
Hemoglobin: 15.6 g/dL (ref 13.0–17.0)
Lymphocytes Relative: 23.7 % (ref 12.0–46.0)
Lymphs Abs: 1.7 10*3/uL (ref 0.7–4.0)
MCHC: 34.7 g/dL (ref 30.0–36.0)
MCV: 92.6 fL (ref 78.0–100.0)
Monocytes Absolute: 0.4 10*3/uL (ref 0.1–1.0)
Monocytes Relative: 5.4 % (ref 3.0–12.0)
Neutro Abs: 5 10*3/uL (ref 1.4–7.7)
Neutrophils Relative %: 69.1 % (ref 43.0–77.0)
Platelets: 233 10*3/uL (ref 150.0–400.0)
RBC: 4.83 Mil/uL (ref 4.22–5.81)
RDW: 12 % (ref 11.5–15.5)
WBC: 7.2 10*3/uL (ref 4.0–10.5)

## 2023-01-21 LAB — HIV ANTIBODY (ROUTINE TESTING W REFLEX): HIV 1&2 Ab, 4th Generation: NONREACTIVE

## 2023-01-21 LAB — COMPREHENSIVE METABOLIC PANEL
ALT: 18 U/L (ref 0–53)
AST: 18 U/L (ref 0–37)
Albumin: 5.1 g/dL (ref 3.5–5.2)
Alkaline Phosphatase: 54 U/L (ref 39–117)
BUN: 17 mg/dL (ref 6–23)
CO2: 27 meq/L (ref 19–32)
Calcium: 9.8 mg/dL (ref 8.4–10.5)
Chloride: 105 meq/L (ref 96–112)
Creatinine, Ser: 0.97 mg/dL (ref 0.40–1.50)
GFR: 102.28 mL/min (ref >60.00)
Glucose, Bld: 102 mg/dL — ABNORMAL HIGH (ref 70–99)
Potassium: 4.1 meq/L (ref 3.5–5.1)
Sodium: 141 meq/L (ref 135–145)
Total Bilirubin: 0.6 mg/dL (ref 0.2–1.2)
Total Protein: 6.9 g/dL (ref 6.0–8.3)

## 2023-01-21 LAB — LIPID PANEL
Cholesterol: 139 mg/dL (ref 0–200)
HDL: 41.2 mg/dL (ref >39.00)
LDL Cholesterol: 72 mg/dL (ref 0–99)
NonHDL: 97.57
Total CHOL/HDL Ratio: 3
Triglycerides: 130 mg/dL (ref 0.0–149.0)
VLDL: 26 mg/dL (ref 0.0–40.0)

## 2023-01-21 LAB — TSH: TSH: 0.81 u[IU]/mL (ref 0.35–5.50)

## 2023-01-21 LAB — HEPATITIS C ANTIBODY: Hepatitis C Ab: NONREACTIVE

## 2023-01-21 NOTE — Patient Instructions (Signed)
You have been scheduled for a follow up appointment on Friday, 01-22-23 with Dr. Barron Alvine (2nd floor). Please arrive 10 minutes early for registration. If you need to reschedule or cancel this appointment please call [phone] as soon as possible. Thank you.  We are requesting your records of your last colonoscopy.  We will get you scheduled for a colonoscopy once you see Dr. Barron Alvine for hemorrhoid bandings.  Thank you for entrusting me with your care and for choosing [facility], Deanna May, NP   If your blood pressure at your visit was 140/90 or greater, please contact your primary care physician to follow up on this. ______________________________________________________  If you are age 74 or older, your body mass index should be between 23-30. Your Body mass index is 26.78 kg/m. If this is out of the aforementioned range listed, please consider follow up with your Primary Care Provider.  If you are age 42 or younger, your body mass index should be between 19-25. Your Body mass index is 26.78 kg/m. If this is out of the aformentioned range listed, please consider follow up with your Primary Care Provider.  ________________________________________________________  The [place] [facility] providers would like to encourage you to use [facility] to communicate with providers for non-urgent requests or questions.  Due to long hold times on the telephone, sending your provider a message by [facility] may be a faster and more efficient way to get a response.  Please allow 48 business hours for a response.  Please remember that this is for non-urgent requests.  _______________________________________________________  Due to recent changes in healthcare laws, you may see the results of your imaging and laboratory studies on MyChart before your provider has had a chance to review them.  We understand that in some cases there may be results that are confusing or concerning to you. Not all laboratory  results come back in the same time frame and the provider may be waiting for multiple results in order to interpret others.  Please give [place] 48 hours in order for your provider to thoroughly review all the results before contacting the office for clarification of your results.

## 2023-01-22 ENCOUNTER — Ambulatory Visit (INDEPENDENT_AMBULATORY_CARE_PROVIDER_SITE_OTHER): Payer: No Typology Code available for payment source | Admitting: Gastroenterology

## 2023-01-22 ENCOUNTER — Encounter: Payer: Self-pay | Admitting: Gastroenterology

## 2023-01-22 VITALS — BP 122/78 | HR 90 | Ht 71.0 in | Wt 197.0 lb

## 2023-01-22 DIAGNOSIS — K649 Unspecified hemorrhoids: Secondary | ICD-10-CM

## 2023-01-22 DIAGNOSIS — Z800 Family history of malignant neoplasm of digestive organs: Secondary | ICD-10-CM

## 2023-01-22 DIAGNOSIS — Z860100 Personal history of colon polyps, unspecified: Secondary | ICD-10-CM

## 2023-01-22 DIAGNOSIS — K641 Second degree hemorrhoids: Secondary | ICD-10-CM

## 2023-01-22 NOTE — Patient Instructions (Addendum)
_______________________________________________________  If your blood pressure at your visit was 140/90 or greater, please contact your primary care physician to follow up on this.  If you are age 34 or younger, your body mass index should be between 19-25. Your Body mass index is 27.48 kg/m. If this is out of the aformentioned range listed, please consider follow up with your Primary Care Provider.  ________________________________________________________  The [place] [facility] providers would like to encourage you to use [facility] to communicate with providers for non-urgent requests or questions.  Due to long hold times on the telephone, sending your provider a message by [facility] may be a faster and more efficient way to get a response.  Please allow 48 business hours for a response.  Please remember that this is for non-urgent requests.  _______________________________________________________  [place] PROCEDURE    FOLLOW-UP CARE   The procedure you have had should have been relatively painless since the banding of the area involved does not have nerve endings and there is no pain sensation.  The rubber band cuts off the blood supply to the hemorrhoid and the band may fall off as soon as 48 hours after the banding (the band may occasionally be seen in the toilet bowl following a bowel movement). You may notice a temporary feeling of fullness in the rectum which should respond adequately to plain Tylenol or Motrin.  Following the banding, avoid strenuous exercise that evening and resume full activity the next day.  A sitz bath (soaking in a warm tub) or bidet is soothing, and can be useful for cleansing the area after bowel movements.     To avoid constipation, take two tablespoons of natural wheat bran, natural oat bran, flax, Benefiber or any over the counter fiber supplement and increase your water intake to 7-8 glasses daily.    Unless you have been prescribed anorectal medication,  do not put anything inside your rectum for two weeks: No suppositories, enemas, fingers, etc.  Occasionally, you may have more bleeding than usual after the banding procedure.  This is often from the untreated hemorrhoids rather than the treated one.  Don't be concerned if there is a tablespoon or so of blood.  If there is more blood than this, lie flat with your bottom higher than your head and apply an ice pack to the area. If the bleeding does not stop within a half an hour or if you feel faint, call our office at [phone] or go to the emergency room.  Problems are not common; however, if there is a substantial amount of bleeding, severe pain, chills, fever or difficulty passing urine (very rare) or other problems, you should call us at [phone] or report to the nearest emergency room.  Do not stay seated continuously for more than 2-3 hours for a day or two after the procedure.  Tighten your buttock muscles 10-15 times every two hours and take 10-15 deep breaths every 1-2 hours.  Do not spend more than a few minutes on the toilet if you cannot empty your bowel; instead re-visit the toilet at a later time.  It was a pleasure to see you today!  Vito Cirigliano, D.O.

## 2023-01-22 NOTE — Progress Notes (Signed)
Chief Complaint:    Symptomatic Internal Hemorrhoids; Hemorrhoid Band Ligation  GI History: 33 year old male with history of symptomatic hemorrhoids (intermittent bleeding, prolapse). He has tried OTC topical ointments, suppositories, and wipes which have not helped. He has had intermittent BRB with wiping. Patients last colonoscopy was approximately 4-5 years ago in [place], [place] due to family history.  Per patient had multiple benign colonic polyps with hemorrhoids.  Patient's family history includes colon CA in his mother approximately at age 75 years old. Paternal grandfather with liver CA.   HPI:     Patient is a 33 y.o. male with a history of symptomatic internal hemorrhoids presenting to the [facility]enterology Clinic for follow-up and ongoing treatment.  He was seen in the office yesterday by Charmaine Downs, NP, and exam notable for grade 2-3 internal hemorrhoids and he was referred today for hemorrhoid banding.  Otherwise normal recent CBC and CMP.  The patient presents with symptomatic grade 3  hemorrhoids, unresponsive to maximal medical therapy, requesting rubber band ligation of symptomatic hemorrhoidal disease.  No change in medical or surgical history, medications, allergies, social history since last appointment.    Review of systems:     No chest pain, no SOB, no fevers, no urinary sx   Past Medical History:  Diagnosis Date   Anal fissure    Anxiety    Bowel obstruction (HCC)    Color blind    Depression    PTSD (post-traumatic stress disorder)    PTSD (post-traumatic stress disorder) 05/19/2012    Patient's surgical history, family medical history, social history, medications and allergies were all reviewed in Epic    No current outpatient medications on file.   No current facility-administered medications for this visit.    Physical Exam:     There were no vitals taken for this visit.  GENERAL:  Pleasant male in NAD PSYCH: : Cooperative, normal affect NEURO:  Alert and oriented x 3, no focal neurologic deficits Rectal exam: Sensation intact and preserved anal wink.  Grade 2 hemorrhoids noted in all positions on anoscopy.  Hypertrophied anal papilla noted on anoscopy.  No external anal fissures noted. Normal sphincter tone. No palpable mass. No blood on the exam glove. (Chaperone: Thompson Grayer, CMA).   IMPRESSION and PLAN:    #1.  Symptomatic internal hemorrhoids: PROCEDURE NOTE: The patient presents with symptomatic grade 2 hemorrhoids, unresponsive to maximal medical therapy, requesting rubber band ligation of symptomatic hemorrhoidal disease.  All risks, benefits and alternative forms of therapy were described and informed consent was obtained.  In the Left Lateral Decubitus position, anoscopic examination revealed grade 2 hemorrhoids in the all position(s).  The anorectum was pre-medicated with RectiCare. The decision was made to band the RA internal hemorrhoid, and the [facility] O'Regan System was used to perform band ligation without complication.  Digital anorectal examination was then performed to assure proper positioning of the band, and to adjust the banded tissue as required.  The patient was discharged home without pain or other issues.  Dietary and behavioral recommendations were given and along with follow-up instructions.     The following adjunctive treatments were recommended:  -Resume high-fiber diet with fiber supplement (i.e. Citrucel or Benefiber) with goal for soft stools without straining to have a BM. -Resume adequate fluid intake.  The patient will return in 4+ weeks for follow-up and possible additional banding as required. No complications were encountered and the patient tolerated the procedure well.   #2.  Family history of colon cancer #  3.  Personal history of colon polyps - Records from previous GI requested by Deanna yesterday.  Will review upon receipt then schedule colonoscopy as appropriate        Shellia Cleverly ,DO, FACG 01/22/2023, 3:07 PM

## 2023-01-22 NOTE — Progress Notes (Signed)
Agree with the assessment and plan as outlined by [facility], FNP-C.  Carlitos Bottino, DO, [facility]

## 2023-01-23 ENCOUNTER — Encounter: Payer: Self-pay | Admitting: Family

## 2023-03-10 ENCOUNTER — Encounter: Payer: Self-pay | Admitting: Gastroenterology

## 2023-03-10 ENCOUNTER — Ambulatory Visit: Payer: No Typology Code available for payment source | Admitting: Gastroenterology

## 2023-03-10 VITALS — BP 124/80 | HR 70 | Ht 71.0 in | Wt 196.0 lb

## 2023-03-10 DIAGNOSIS — K641 Second degree hemorrhoids: Secondary | ICD-10-CM

## 2023-03-10 DIAGNOSIS — Z860100 Personal history of colon polyps, unspecified: Secondary | ICD-10-CM

## 2023-03-10 DIAGNOSIS — Z800 Family history of malignant neoplasm of digestive organs: Secondary | ICD-10-CM

## 2023-03-10 DIAGNOSIS — K649 Unspecified hemorrhoids: Secondary | ICD-10-CM

## 2023-03-10 MED ORDER — SUFLAVE 178.7 G PO SOLR: 1.0000 | Freq: Once | ORAL | 0 refills | Status: AC

## 2023-03-10 NOTE — Progress Notes (Signed)
 Chief Complaint:    Symptomatic Internal Hemorrhoids; Hemorrhoid Band Ligation  GI History: 33 year old male with history of symptomatic hemorrhoids (intermittent bleeding, prolapse). He has tried OTC topical ointments, suppositories, and wipes which have not helped. He has had intermittent BRB with wiping. Patients last colonoscopy was approximately 4-5 years ago in [place], [place] due to family history.  Per patient had multiple benign colonic polyps with hemorrhoids.   Patient's family history includes colon CA in his mother approximately at age 63 years old. Paternal grandfather with liver CA.   - 01/22/2023: Banding of RA hemorrhoid  HPI:     Patient is a 33 y.o. malewith a history of symptomatic internal hemorrhoids presenting to the Gastroenterology Clinic for follow-up and ongoing treatment. The patient presents with symptomatic grade 2 hemorrhoids, unresponsive to maximal medical therapy, requesting rubber band ligation of symptomatic hemorrhoidal disease.  Did well with first hemorrhoid banding.  Still with intermittent BRBPR.  No change in medical or surgical history, medications, allergies, social history since last appointment with me.   Review of systems:     No chest pain, no SOB, no fevers, no urinary sx   Past Medical History:  Diagnosis Date   Anal fissure    Anxiety    Bowel obstruction (HCC)    Color blind    Depression    PTSD (post-traumatic stress disorder)    PTSD (post-traumatic stress disorder) 05/19/2012    Patient's surgical history, family medical history, social history, medications and allergies were all reviewed in Epic    No current outpatient medications on file.   No current facility-administered medications for this visit.    Physical Exam:     BP 124/80   Pulse 70   Ht 5\' 11"  (1.803 m)   Wt 196 lb (88.9 kg)   BMI 27.34 kg/m   GENERAL:  Pleasant male in NAD PSYCH: : Cooperative, normal affect ENEURO: Alert and oriented x 3, no  focal neurologic deficits Rectal exam: Sensation intact and preserved anal wink.  Grade 2 hemorrhoids noted in LL and RP positions on exam.  No external anal fissures noted. Normal sphincter tone. No palpable mass. No blood on the exam glove. (Chaperone: June, CMA).   IMPRESSION and PLAN:    #1.  Symptomatic internal hemorrhoids: PROCEDURE NOTE: The patient presents with symptomatic grade 2 hemorrhoids, unresponsive to maximal medical therapy, requesting rubber band ligation of symptomatic hemorrhoidal disease.  All risks, benefits and alternative forms of therapy were described and informed consent was obtained.  In the Left Lateral Decubitus position, anoscopic examination revealed grade 2 hemorrhoids in the LL and RP position(s).  The anorectum was pre-medicated with RectiCare. The decision was made to band the LL internal hemorrhoid, and the [facility] O'Regan System was used to perform band ligation without complication.  Digital anorectal examination was then performed to assure proper positioning of the band, and to adjust the banded tissue as required.  The patient was discharged home without pain or other issues.  Dietary and behavioral recommendations were given and along with follow-up instructions.     The following adjunctive treatments were recommended:  -Resume high-fiber diet with fiber supplement (i.e. Citrucel or Benefiber) with goal for soft stools without straining to have a BM. -Resume adequate fluid intake.  The patient will return in 4+ weeks for follow-up and possible additional banding as required. No complications were encountered and the patient tolerated the procedure well.      #2.  Family history of colon cancer #  3.  Personal history of colon polyps Never received copy of last colonoscopy report, but per patient, was notable for polyps and was approximately 5 years ago.  Given personal history of polyps and family history of colon cancer (mother, age <71), recommend  repeating colonoscopy now - Schedule colonoscopy   The indications, risks, and benefits of colonoscopy were explained to the patient in detail. Risks include but are not limited to bleeding, perforation, adverse reaction to medications, and cardiopulmonary compromise. Sequelae include but are not limited to the possibility of surgery, hospitalization, and mortality. The patient verbalized understanding and wished to proceed. All questions answered, referred to the scheduler and bowel prep ordered. Further recommendations pending results of the exam.    Shellia Cleverly ,DO, FACG 03/10/2023, 10:24 AM

## 2023-03-10 NOTE — Patient Instructions (Addendum)
 You have been scheduled for a 3rd hemorrhoid banding appointment on 05/19/23 at 3:40 pm. Please arrive 10 minutes early for registration. If you need to reschedule or cancel this appointment please call [phone] as soon as possible. Thank you.   You have been scheduled for a colonoscopy. Please follow written instructions given to you at your visit today.   [facility] has implemented a new process for scheduling procedures.  Please note your arrival time for the [facility] is the appointment time that is shown on your written instructions.  Please do not arrive one hour prior to the time listed in your instructions.  Please ignore any outside notifications to arrive one hour early.  We apologize for any confusion and look forward to seeing you for your procedure.   If you use inhalers (even only as needed), please bring them with you on the day of your procedure.  DO NOT TAKE 7 DAYS PRIOR TO TEST- Trulicity (dulaglutide) Ozempic, Wegovy (semaglutide) Mounjaro (tirzepatide) Bydureon Bcise (exanatide extended release)  DO NOT TAKE 1 DAY PRIOR TO YOUR TEST Rybelsus (semaglutide) Adlyxin (lixisenatide) Victoza (liraglutide) Byetta (exanatide) ___________________________________________________________________________  Todd Drake will receive your bowel preparation through [facility], which ensures the lowest copay and home delivery, with outreach via text or call from an [phone] number. Please respond promptly to avoid rescheduling of your procedure. If you are interested in alternative options or have any questions regarding your prep, please contact them at [phone] ____________________________________________________________________________  Your Provider Has Sent Your Bowel Prep Regimen To [facility]   [facility] will contact you to verify your information and collect your copay, if applicable. Enjoy the comfort of your home while your prescription is mailed to you, FREE of any  shipping charges.   [facility] accepts all major insurance benefits and applies discounts & coupons.  Have additional questions?   Chat: [email or web address] Call: [phone] Email: [email or web address] NCPDP: [id number]  How will [facility] contact you?  With a Welcome phone call,  a Welcome text and a checkout link in text form.  Texts you receive from [phone] Are NOT Spam.  *To set up delivery, you must complete the checkout process via link or speak to one of the patient care representatives. If [facility] is unable to reach you, your prescription may be delayed.  To avoid long hold times on the phone, you may also utilize the secure chat feature on the [facility] website to request that they call you back for transaction completion or to expedite your concerns.      HEMORRHOID BANDING PROCEDURE    FOLLOW-UP CARE   The procedure you have had should have been relatively painless since the banding of the area involved does not have nerve endings and there is no pain sensation.  The rubber band cuts off the blood supply to the hemorrhoid and the band may fall off as soon as 48 hours after the banding (the band may occasionally be seen in the toilet bowl following a bowel movement). You may notice a temporary feeling of fullness in the rectum which should respond adequately to plain Tylenol or Motrin.  Following the banding, avoid strenuous exercise that evening and resume full activity the next day.  A sitz bath (soaking in a warm tub) or bidet is soothing, and can be useful for cleansing the area after bowel movements.     To avoid constipation, take two tablespoons of natural wheat bran, natural oat bran, flax, Benefiber or any over the counter fiber supplement and increase  your water intake to 7-8 glasses daily.    Unless you have been prescribed anorectal medication, do not put anything inside your rectum for two weeks: No suppositories, enemas, fingers,  etc.  Occasionally, you may have more bleeding than usual after the banding procedure.  This is often from the untreated hemorrhoids rather than the treated one.  Don't be concerned if there is a tablespoon or so of blood.  If there is more blood than this, lie flat with your bottom higher than your head and apply an ice pack to the area. If the bleeding does not stop within a half an hour or if you feel faint, call our office at [phone] or go to the emergency room.  Problems are not common; however, if there is a substantial amount of bleeding, severe pain, chills, fever or difficulty passing urine (very rare) or other problems, you should call us at [phone] or report to the nearest emergency room.  Do not stay seated continuously for more than 2-3 hours for a day or two after the procedure.  Tighten your buttock muscles 10-15 times every two hours and take 10-15 deep breaths every 1-2 hours.  Do not spend more than a few minutes on the toilet if you cannot empty your bowel; instead re-visit the toilet at a later time.

## 2023-03-14 ENCOUNTER — Telehealth: Payer: Self-pay | Admitting: Gastroenterology

## 2023-03-14 NOTE — Telephone Encounter (Signed)
 Patient called today concerned for his colon prep tomorrow.  He states his appointment got changed from 3 PM to 9 AM so he is unsure when to drink the prep.  I sent him in a MyChart message with instructions regarding his GoLytely prep and advised  Day before colonoscopy: Take 4 Dulcolax tablets at 3 pm with water.  Continue clear liquids. Begin prep at 5pm.  Day of colonoscopy: Beginning 5 hours before your arrival time. Drink 8 ounces of Golytely prep every 15 minutes until the remaining half (64 ounces) of the solution is finished- 8 glasses .   See mychart message for extensive details provided to patient with prep instructions

## 2023-03-15 ENCOUNTER — Encounter: Payer: Self-pay | Admitting: Gastroenterology

## 2023-03-15 ENCOUNTER — Ambulatory Visit (AMBULATORY_SURGERY_CENTER): Admitting: Gastroenterology

## 2023-03-15 VITALS — BP 102/66 | HR 61 | Temp 98.6°F | Resp 10 | Ht 71.0 in | Wt 192.0 lb

## 2023-03-15 DIAGNOSIS — K649 Unspecified hemorrhoids: Secondary | ICD-10-CM | POA: Diagnosis not present

## 2023-03-15 DIAGNOSIS — Z800 Family history of malignant neoplasm of digestive organs: Secondary | ICD-10-CM

## 2023-03-15 DIAGNOSIS — Z1211 Encounter for screening for malignant neoplasm of colon: Secondary | ICD-10-CM | POA: Diagnosis present

## 2023-03-15 DIAGNOSIS — Z9889 Other specified postprocedural states: Secondary | ICD-10-CM | POA: Diagnosis not present

## 2023-03-15 DIAGNOSIS — D128 Benign neoplasm of rectum: Secondary | ICD-10-CM

## 2023-03-15 DIAGNOSIS — K6289 Other specified diseases of anus and rectum: Secondary | ICD-10-CM

## 2023-03-15 DIAGNOSIS — K626 Ulcer of anus and rectum: Secondary | ICD-10-CM

## 2023-03-15 DIAGNOSIS — K641 Second degree hemorrhoids: Secondary | ICD-10-CM

## 2023-03-15 DIAGNOSIS — Z860100 Personal history of colon polyps, unspecified: Secondary | ICD-10-CM

## 2023-03-15 MED ORDER — SODIUM CHLORIDE 0.9 % IV SOLN: 500.0000 mL | INTRAVENOUS | Status: DC

## 2023-03-15 NOTE — Progress Notes (Signed)
 GASTROENTEROLOGY PROCEDURE H&P NOTE   Primary Care Physician: Dulce Sellar, NP    Reason for Procedure:   Family history of colon cancer, personal hx of colon polyps  Plan:    Colonoscopy  Patient is appropriate for endoscopic procedure(s) in the ambulatory ([facility]) setting.  The nature of the procedure, as well as the risks, benefits, and alternatives were carefully and thoroughly reviewed with the patient. Ample time for discussion and questions allowed. The patient understood, was satisfied, and agreed to proceed.     HPI: Todd Drake is a 33 y.o. male who presents for colonoscopy due to family history of colon cancer and personal hx of colon polyps.  Patient was most recently seen in the Gastroenterology Clinic on 03/10/2023 by me.  No interval change in medical history since that appointment. Please refer to that note for full details regarding GI history and clinical presentation.   Past Medical History:  Diagnosis Date   Anal fissure    Anxiety    Bowel obstruction (HCC)    Color blind    Depression    PTSD (post-traumatic stress disorder)    PTSD (post-traumatic stress disorder) 05/19/2012    Past Surgical History:  Procedure Laterality Date   COLONOSCOPY     DG HAND RIGHT COMPLETE ([facility] HX)     SHOULDER ARTHROSCOPY Left    tore ligament, 2024    Prior to Admission medications   Not on File    No current outpatient medications on file.   Current Facility-Administered Medications  Medication Dose Route Frequency Provider Last Rate Last Admin   0.9 %  sodium chloride infusion  500 mL Intravenous Continuous Norie Latendresse V, DO        Allergies as of 03/15/2023 - Review Complete 03/15/2023  Allergen Reaction Noted   Lexapro [escitalopram oxalate] Rash and Other (See Comments) 06/05/2012   Remeron [mirtazapine] Rash and Other (See Comments) 06/05/2012    Family History  Problem Relation Age of Onset   Colon cancer Mother    Depression Mother     Cancer - Colon Mother    Liver cancer Maternal Grandfather    Esophageal cancer Neg Hx    Rectal cancer Neg Hx    Stomach cancer Neg Hx     Social History   Socioeconomic History   Marital status: Married    Spouse name: Not on file   Number of children: 0   Years of education: Not on file   Highest education level: Not on file  Occupational History   Not on file  Tobacco Use   Smoking status: Never   Smokeless tobacco: Never  Vaping Use   Vaping status: Not on file  Substance and Sexual Activity   Alcohol use: Yes    Alcohol/week: 1.0 standard drink of alcohol    Types: 1 Standard drinks or equivalent per week   Drug use: No   Sexual activity: Yes  Other Topics Concern   Not on file  Social History Narrative   Not on file   Social Drivers of Health   Financial Resource Strain: Patient Declined (01/16/2023)   Overall Financial Resource Strain (CARDIA)    Difficulty of Paying Living Expenses: Patient declined  Food Insecurity: Patient Declined (01/16/2023)   Hunger Vital Sign    Worried About Running Out of Food in the Last Year: Patient declined    Ran Out of Food in the Last Year: Patient declined  Transportation Needs: Patient Declined (01/16/2023)   PRAPARE -  Administrator, Civil Service (Medical): Patient declined    Lack of Transportation (Non-Medical): Patient declined  Physical Activity: Sufficiently Active (01/16/2023)   Exercise Vital Sign    Days of Exercise per Week: 4 days    Minutes of Exercise per Session: 60 min  Stress: Stress Concern Present (01/16/2023)   [employer] of Occupational Health - Occupational Stress Questionnaire    Feeling of Stress : To some extent  Social Connections: Unknown (01/16/2023)   Social Connection and Isolation Panel [NHANES]    Frequency of Communication with Friends and Family: Patient declined    Frequency of Social Gatherings with Friends and Family: Patient declined    Attends Religious Services:  Patient declined    Database administrator or Organizations: Patient declined    Attends Engineer, structural: Not on file    Marital Status: Married  Catering manager Violence: Not on file    Physical Exam: Vital signs in last 24 hours: @BP  111/78   Pulse 64   Temp 98.6 F (37 C) (Skin)   Resp 13   Ht 5\' 11"  (1.803 m)   Wt 192 lb (87.1 kg)   SpO2 98%   BMI 26.78 kg/m  GEN: NAD EYE: Sclerae anicteric ENT: MMM CV: Non-tachycardic Pulm: CTA b/l GI: Soft, NT/ND NEURO:  Alert & Oriented x 3   Doristine Locks, DO [facility]   03/15/2023 9:45 AM

## 2023-03-15 NOTE — Progress Notes (Signed)
 Called to room to assist during endoscopic procedure.  Patient ID and intended procedure confirmed with present staff. Received instructions for my participation in the procedure from the performing physician.

## 2023-03-15 NOTE — Progress Notes (Signed)
 A/O x 3, gd SR's, VSS, report to RN

## 2023-03-15 NOTE — Progress Notes (Signed)
 Patient states there have been no changes to medical or surgical history since time of pre-visit.

## 2023-03-15 NOTE — Patient Instructions (Signed)

## 2023-03-15 NOTE — Op Note (Signed)
 [facility] Patient Name: Todd Drake Procedure Date: 03/15/2023 9:33 AM MRN: [id number] Endoscopist: Doristine Locks , MD, [id number] Age: 33 Referring MD:  Date of Birth: [birth year] Gender: Male Account #: [id number] Procedure:                Colonoscopy Indications:              Screening in patient at increased risk: Colorectal                            cancer in mother before age 43                           Personal history of colonic polyps on last                            colonoscopy 5 years ago at outside facility                           Recent hemorrhoid banding Medicines:                Monitored Anesthesia Care Procedure:                Pre-Anesthesia Assessment:                           - Prior to the procedure, a History and Physical                            was performed, and patient medications and                            allergies were reviewed. The patient's tolerance of                            previous anesthesia was also reviewed. The risks                            and benefits of the procedure and the sedation                            options and risks were discussed with the patient.                            All questions were answered, and informed consent                            was obtained. Prior Anticoagulants: The patient has                            taken no anticoagulant or antiplatelet agents. ASA                            Grade Assessment: II - A patient with mild systemic  disease. After reviewing the risks and benefits,                            the patient was deemed in satisfactory condition to                            undergo the procedure.                           After obtaining informed consent, the colonoscope                            was passed under direct vision. Throughout the                            procedure, the patient's blood pressure, pulse, and                             oxygen saturations were monitored continuously. The                            CF [id number] #[id number] was introduced through the anus                            and advanced to the the terminal ileum. The                            colonoscopy was performed without difficulty. The                            patient tolerated the procedure well. The quality                            of the bowel preparation was good. The terminal                            ileum, ileocecal valve, appendiceal orifice, and                            rectum were photographed. Scope In: 9:58:00 AM Scope Out: 10:16:35 AM Scope Withdrawal Time: 0 hours 16 minutes 44 seconds  Total Procedure Duration: 0 hours 18 minutes 35 seconds  Findings:                 Hemorrhoids were found on perianal exam.                           A 3 mm polyp was found in the rectum. The polyp was                            sessile. The polyp was removed with a cold snare.                            Resection and retrieval were complete. Estimated  blood loss was minimal.                           An area of granular mucosa was found in the distal                            rectum. Biopsies were taken with a cold forceps for                            histology. Estimated blood loss was minimal.                           A single (solitary) ulcer was found in the rectum,                            consistent with recent hemorrhoid banding. No                            bleeding was present. There was a scar from prior                            banding also noted in the distal rectum.                           The recto-sigmoid colon and cecum appeared normal.                           The terminal ileum appeared normal. Complications:            No immediate complications. Estimated Blood Loss:     Estimated blood loss was minimal. Impression:               - Hemorrhoids found on perianal exam.                            - One 3 mm polyp in the rectum, removed with a cold                            snare. Resected and retrieved.                           - Granularity in the distal rectum. Biopsied.                           - A post hemorrhoid banding ulcer in the rectum.                           - The cecum and recto-sigmoid colon are normal.                           - The examined portion of the ileum was normal. Recommendation:           - Patient has a contact number available for  emergencies. The signs and symptoms of potential                            delayed complications were discussed with the                            patient. Return to normal activities tomorrow.                            Written discharge instructions were provided to the                            patient.                           - Resume previous diet.                           - Continue present medications.                           - Await pathology results.                           - Repeat colonoscopy for surveillance based on                            pathology results. Doristine Locks, MD 03/15/2023 10:30:30 AM

## 2023-03-16 ENCOUNTER — Telehealth: Payer: Self-pay

## 2023-03-16 NOTE — Telephone Encounter (Signed)
  Follow up Call-     03/15/2023    8:55 AM  Call back number  Post procedure Call Back phone  # [phone]  Permission to leave phone message Yes    Post op call attempted, no answer, left VM.

## 2023-03-17 LAB — SURGICAL PATHOLOGY

## 2023-03-22 ENCOUNTER — Encounter: Payer: Self-pay | Admitting: Gastroenterology

## 2023-03-26 ENCOUNTER — Telehealth: Payer: Self-pay | Admitting: Gastroenterology

## 2023-03-26 MED ORDER — NON FORMULARY: 1 refills | Status: AC

## 2023-03-26 NOTE — Telephone Encounter (Signed)
 Inbound call from patient stating he has having complications with hemorrhoids. States previous banding appointments have not been helping. Requesting a call back. Please advise, thank you.

## 2023-03-26 NOTE — Telephone Encounter (Signed)
 Dr. Frankey Shown patient with Grade II hemorrhoids. Has completed 2 hemorrhoid bandings and recent colonoscopy on 03/15/23. Next banding scheduled for 06/03/23 with Dr. Barron Alvine. Please advise, thanks.

## 2023-03-26 NOTE — Telephone Encounter (Signed)
 Called and spoke with patient. He endorses pain only, denies any rectal bleeding. Patient has been advised that we will call in Diltiazem 2%/Lidocaine % ointment for him to use rectally TID for possible fissure or ulcer. Patient has been advised to purchase Calmol 4 suppositories OTC in addition to this. Patient's appt has been rescheduled to 04/15/23 at 2:20 pm with Dr. Barron Alvine. Patient verbalized understanding and had no concerns at the end of the call.  Called [facility] and spoke with Rennert. Provided verbal RX for Diltiazem/Lidocaine ointment. Provided patient's demographic and contact information. Pharmacy will contact patient once medication is ready for pickup.

## 2023-04-15 ENCOUNTER — Encounter: Payer: Self-pay | Admitting: Gastroenterology

## 2023-04-15 ENCOUNTER — Ambulatory Visit (INDEPENDENT_AMBULATORY_CARE_PROVIDER_SITE_OTHER): Admitting: Gastroenterology

## 2023-04-15 VITALS — BP 130/80 | HR 96 | Ht 71.0 in | Wt 196.0 lb

## 2023-04-15 DIAGNOSIS — K649 Unspecified hemorrhoids: Secondary | ICD-10-CM

## 2023-04-15 DIAGNOSIS — K641 Second degree hemorrhoids: Secondary | ICD-10-CM | POA: Diagnosis not present

## 2023-04-15 DIAGNOSIS — Z800 Family history of malignant neoplasm of digestive organs: Secondary | ICD-10-CM

## 2023-04-15 NOTE — Patient Instructions (Addendum)
 HEMORRHOID BANDING PROCEDURE    FOLLOW-UP CARE   The procedure you have had should have been relatively painless since the banding of the area involved does not have nerve endings and there is no pain sensation.  The rubber band cuts off the blood supply to the hemorrhoid and the band may fall off as soon as 48 hours after the banding (the band may occasionally be seen in the toilet bowl following a bowel movement). You may notice a temporary feeling of fullness in the rectum which should respond adequately to plain Tylenol or Motrin.  Following the banding, avoid strenuous exercise that evening and resume full activity the next day.  A sitz bath (soaking in a warm tub) or bidet is soothing, and can be useful for cleansing the area after bowel movements.     To avoid constipation, take two tablespoons of natural wheat bran, natural oat bran, flax, Benefiber or any over the counter fiber supplement and increase your water intake to 7-8 glasses daily.    Unless you have been prescribed anorectal medication, do not put anything inside your rectum for two weeks: No suppositories, enemas, fingers, etc.  Occasionally, you may have more bleeding than usual after the banding procedure.  This is often from the untreated hemorrhoids rather than the treated one.  Don't be concerned if there is a tablespoon or so of blood.  If there is more blood than this, lie flat with your bottom higher than your head and apply an ice pack to the area. If the bleeding does not stop within a half an hour or if you feel faint, call our office at [phone] or go to the emergency room.  Problems are not common; however, if there is a substantial amount of bleeding, severe pain, chills, fever or difficulty passing urine (very rare) or other problems, you should call us at [phone] or report to the nearest emergency room.  Do not stay seated continuously for more than 2-3 hours for a day or two after the procedure.   Tighten your buttock muscles 10-15 times every two hours and take 10-15 deep breaths every 1-2 hours.  Do not spend more than a few minutes on the toilet if you cannot empty your bowel; instead re-visit the toilet at a later time.   It was a pleasure to see you today!  Vito Cirigliano, D.O.

## 2023-04-15 NOTE — Progress Notes (Signed)
 Chief Complaint:    Symptomatic Internal Hemorrhoids; Hemorrhoid Band Ligation  GI History: 33 year old male with history of symptomatic hemorrhoids (intermittent bleeding, prolapse). He has tried OTC topical ointments, suppositories, and wipes which have not helped. He has had intermittent BRB with wiping. He had a colonoscopy  approximately 2020 in [place], [place] due to family history.  Per patient had multiple benign colonic polyps with hemorrhoids.   Patient's family history includes colon CA in his mother approximately at age 77 years old. Paternal grandfather with liver CA.    - 01/22/2023: Banding of RA hemorrhoid - 03/10/2023: Banding of LL hemorrhoid - 03/15/2023: Colonoscopy: 3 mm rectal inflammatory polyp, benign inflammatory tissue in the distal rectum, otherwise normal-appearing colon and normal TI.  Repeat in 5 years due to family history    HPI:     Patient is a 33 y.o. male with a history of symptomatic internal hemorrhoids presenting [facility] for follow-up and ongoing treatment. The patient presents with symptomatic grade 2 hemorrhoids, unresponsive to maximal medical therapy, requesting rubber band ligation of symptoms back hemorrhoidal disease.  No change in medical or surgical history, medications, allergies, social history since last appointment with me.   Review of systems:     No chest pain, no SOB, no fevers, no urinary sx   Past Medical History:  Diagnosis Date   Anal fissure    Anxiety    Bowel obstruction (HCC)    Color blind    Depression    PTSD (post-traumatic stress disorder)    PTSD (post-traumatic stress disorder) 05/19/2012    Patient's surgical history, family medical history, social history, medications and allergies were all reviewed in Epic    Current Outpatient Medications  Medication Sig Dispense Refill   NON FORMULARY Diltiazem 2%/Lidocaine5% compound Use 3 x rectally daily for 2 months to heal anal fissure 30 g 1    No current facility-administered medications for this visit.    Physical Exam:     BP 130/80   Pulse 96   Ht 5\' 11"  (1.803 m)   Wt 196 lb (88.9 kg)   BMI 27.34 kg/m   GENERAL:  Pleasant male in NAD PSYCH: : Cooperative, normal affect NEURO: Alert and oriented x 3, no focal neurologic deficits Rectal exam: Sensation intact and preserved anal wink.  Small external skin tag.  Small grade 2 hemorrhoid in LL position, small grade 1 hemorrhoid in RP position on exam.  No blood on the exam glove. (Chaperone: Judeth Cornfield, CMA).   IMPRESSION and PLAN:    #1.  Symptomatic internal hemorrhoids: PROCEDURE NOTE: The patient presents with symptomatic grade 2 hemorrhoids, unresponsive to maximal medical therapy, requesting rubber band ligation of symptomatic hemorrhoidal disease.  All risks, benefits and alternative forms of therapy were described and informed consent was obtained.  In the Left Lateral Decubitus position, anoscopic examination revealed small grade 2 hemorrhoid in LL position and small grade 1 hemorrhoid in RP position  The anorectum was pre-medicated with RectiCare. The decision was made to band both the LL and RP internal hemorrhoid columns, and the [facility] O'Regan System was used to perform band ligation without complication.  Digital anorectal examination was then performed to assure proper positioning of the band, and to adjust the banded tissue as required.  The patient was discharged home without pain or other issues.  Dietary and behavioral recommendations were given and along with follow-up instructions.     The following adjunctive treatments were recommended:  -Resume high-fiber diet  with fiber supplement (i.e. Citrucel or Benefiber) with goal for soft stools without straining to have a BM. -Resume adequate fluid intake.  The patient will return as needed if return of hemorrhoidal symptoms for evaluation and possible additional banding as required. No complications were  encountered and the patient tolerated the procedure well.      #2.  Family history of colon cancer - Repeat colonoscopy in 2030 for ongoing colon cancer screening       Verlin Dike Rena Sweeden ,DO, FACG 04/15/2023, 2:50 PM

## 2023-05-03 ENCOUNTER — Telehealth: Payer: Self-pay | Admitting: Gastroenterology

## 2023-05-03 NOTE — Telephone Encounter (Signed)
 Dr. Karene Oto, would it be reasonable to place referral to [facility] for further evaluation? Or do you have any other recommendations at this time?

## 2023-05-03 NOTE — Telephone Encounter (Signed)
 PT has had multiple hem bandings and very concerned its not getting better. He would like to discuss any other options for him. Please advise.

## 2023-05-03 NOTE — Telephone Encounter (Signed)
 MyChart message sent to patient with recommendation for surgical referral. Will await patient's response before faxing.

## 2023-05-04 NOTE — Telephone Encounter (Signed)
 Referral, records, patient's demographics, and insurance information faxed to [facility] at [phone]. Pt notified via MyChart.

## 2023-05-19 ENCOUNTER — Encounter: Admitting: Gastroenterology

## 2023-06-02 ENCOUNTER — Ambulatory Visit: Payer: Self-pay | Admitting: Surgery

## 2023-06-03 ENCOUNTER — Encounter: Admitting: Gastroenterology

## 2023-07-22 ENCOUNTER — Other Ambulatory Visit: Payer: Self-pay | Admitting: Surgery

## 2023-07-23 LAB — SURGICAL PATHOLOGY

## 2023-07-26 ENCOUNTER — Ambulatory Visit: Payer: Self-pay | Admitting: Surgery

## 2024-01-25 ENCOUNTER — Encounter: Payer: No Typology Code available for payment source | Admitting: Family

## 2024-01-27 ENCOUNTER — Encounter: Admitting: Family
